# Patient Record
Sex: Male | Born: 1960
Health system: Southern US, Community
[De-identification: ages and names within clinical notes are randomized; demographics above are authoritative.]

## PROBLEM LIST (undated history)

## (undated) DIAGNOSIS — R011 Cardiac murmur, unspecified: Secondary | ICD-10-CM

## (undated) DIAGNOSIS — G4730 Sleep apnea, unspecified: Secondary | ICD-10-CM

## (undated) DIAGNOSIS — E785 Hyperlipidemia, unspecified: Secondary | ICD-10-CM

## (undated) DIAGNOSIS — T7840XA Allergy, unspecified, initial encounter: Secondary | ICD-10-CM

## (undated) DIAGNOSIS — F32A Depression, unspecified: Secondary | ICD-10-CM

## (undated) DIAGNOSIS — F329 Major depressive disorder, single episode, unspecified: Secondary | ICD-10-CM

## (undated) DIAGNOSIS — E8881 Metabolic syndrome: Secondary | ICD-10-CM

## (undated) DIAGNOSIS — I10 Essential (primary) hypertension: Secondary | ICD-10-CM

## (undated) HISTORY — DX: Essential (primary) hypertension: I10

## (undated) HISTORY — DX: Major depressive disorder, single episode, unspecified: F32.9

## (undated) HISTORY — DX: Depression, unspecified: F32.A

## (undated) HISTORY — DX: Allergy, unspecified, initial encounter: T78.40XA

## (undated) HISTORY — DX: Hyperlipidemia, unspecified: E78.5

## (undated) HISTORY — PX: VASECTOMY: SHX75

## (undated) HISTORY — DX: Metabolic syndrome: E88.81

## (undated) HISTORY — DX: Cardiac murmur, unspecified: R01.1

## (undated) HISTORY — DX: Sleep apnea, unspecified: G47.30

## (undated) HISTORY — DX: Metabolic syndrome: E88.810

---

## 2003-12-17 ENCOUNTER — Encounter: Admission: RE | Admit: 2003-12-17 | Discharge: 2004-01-31 | Payer: Self-pay | Admitting: Neurosurgery

## 2008-10-23 ENCOUNTER — Encounter: Payer: Self-pay | Admitting: Family Medicine

## 2009-10-21 ENCOUNTER — Ambulatory Visit: Payer: Self-pay | Admitting: Family Medicine

## 2009-10-21 DIAGNOSIS — R5383 Other fatigue: Secondary | ICD-10-CM

## 2009-10-21 DIAGNOSIS — F329 Major depressive disorder, single episode, unspecified: Secondary | ICD-10-CM | POA: Insufficient documentation

## 2009-10-21 DIAGNOSIS — F3289 Other specified depressive episodes: Secondary | ICD-10-CM | POA: Insufficient documentation

## 2009-10-21 DIAGNOSIS — R5381 Other malaise: Secondary | ICD-10-CM | POA: Insufficient documentation

## 2009-10-21 DIAGNOSIS — I10 Essential (primary) hypertension: Secondary | ICD-10-CM | POA: Insufficient documentation

## 2009-10-22 ENCOUNTER — Encounter (INDEPENDENT_AMBULATORY_CARE_PROVIDER_SITE_OTHER): Payer: Self-pay | Admitting: *Deleted

## 2009-10-22 ENCOUNTER — Ambulatory Visit: Payer: Self-pay | Admitting: Family Medicine

## 2009-10-22 LAB — CONVERTED CEMR LAB
Bilirubin Urine: NEGATIVE
Glucose, Urine, Semiquant: NEGATIVE

## 2009-10-24 LAB — CONVERTED CEMR LAB
BUN: 15 mg/dL (ref 6–23)
Basophils Relative: 0.2 % (ref 0.0–3.0)
Bilirubin, Direct: 0 mg/dL (ref 0.0–0.3)
CO2: 32 meq/L (ref 19–32)
Chloride: 99 meq/L (ref 96–112)
Cholesterol: 245 mg/dL — ABNORMAL HIGH (ref 0–200)
Creatinine, Ser: 1 mg/dL (ref 0.4–1.5)
Direct LDL: 154.7 mg/dL
Eosinophils Absolute: 0.1 10*3/uL (ref 0.0–0.7)
MCHC: 31.8 g/dL (ref 30.0–36.0)
MCV: 92 fL (ref 78.0–100.0)
Monocytes Absolute: 0.4 10*3/uL (ref 0.1–1.0)
Neutrophils Relative %: 61.3 % (ref 43.0–77.0)
PSA: 0.82 ng/mL (ref 0.10–4.00)
Platelets: 218 10*3/uL (ref 150.0–400.0)
TSH: 1.65 microintl units/mL (ref 0.35–5.50)
Total Bilirubin: 0.7 mg/dL (ref 0.3–1.2)
Total Protein: 7.7 g/dL (ref 6.0–8.3)

## 2009-11-07 ENCOUNTER — Encounter (INDEPENDENT_AMBULATORY_CARE_PROVIDER_SITE_OTHER): Payer: Self-pay | Admitting: *Deleted

## 2009-11-10 ENCOUNTER — Ambulatory Visit: Payer: Self-pay | Admitting: Gastroenterology

## 2009-11-17 ENCOUNTER — Ambulatory Visit: Payer: Self-pay | Admitting: Family Medicine

## 2009-11-17 DIAGNOSIS — J309 Allergic rhinitis, unspecified: Secondary | ICD-10-CM | POA: Insufficient documentation

## 2009-11-17 DIAGNOSIS — R197 Diarrhea, unspecified: Secondary | ICD-10-CM | POA: Insufficient documentation

## 2009-11-26 ENCOUNTER — Ambulatory Visit: Payer: Self-pay | Admitting: Gastroenterology

## 2009-11-28 ENCOUNTER — Encounter: Payer: Self-pay | Admitting: Gastroenterology

## 2009-12-10 ENCOUNTER — Ambulatory Visit: Payer: Self-pay | Admitting: Family Medicine

## 2009-12-12 ENCOUNTER — Encounter: Payer: Self-pay | Admitting: Family Medicine

## 2009-12-16 ENCOUNTER — Encounter (INDEPENDENT_AMBULATORY_CARE_PROVIDER_SITE_OTHER): Payer: Self-pay | Admitting: *Deleted

## 2010-01-15 ENCOUNTER — Ambulatory Visit: Payer: Self-pay | Admitting: Gastroenterology

## 2010-01-21 ENCOUNTER — Telehealth: Payer: Self-pay | Admitting: Family Medicine

## 2010-01-22 ENCOUNTER — Ambulatory Visit: Payer: Self-pay | Admitting: Family Medicine

## 2010-01-22 DIAGNOSIS — E538 Deficiency of other specified B group vitamins: Secondary | ICD-10-CM | POA: Insufficient documentation

## 2010-01-22 LAB — CONVERTED CEMR LAB
Folate: 10.1 ng/mL
IgA: 332 mg/dL (ref 68–378)

## 2010-01-28 ENCOUNTER — Ambulatory Visit: Payer: Self-pay | Admitting: Family Medicine

## 2010-02-04 ENCOUNTER — Ambulatory Visit: Payer: Self-pay | Admitting: Family Medicine

## 2010-02-05 ENCOUNTER — Encounter: Payer: Self-pay | Admitting: Family Medicine

## 2010-02-19 ENCOUNTER — Ambulatory Visit: Payer: Self-pay | Admitting: Gastroenterology

## 2010-02-19 LAB — CONVERTED CEMR LAB
Sed Rate: 10 mm/hr (ref 0–22)
TSH: 1.91 microintl units/mL (ref 0.35–5.50)

## 2010-03-04 ENCOUNTER — Ambulatory Visit: Payer: Self-pay | Admitting: Family Medicine

## 2010-03-05 ENCOUNTER — Encounter: Payer: Self-pay | Admitting: Gastroenterology

## 2010-04-14 ENCOUNTER — Telehealth: Payer: Self-pay | Admitting: Gastroenterology

## 2010-10-07 ENCOUNTER — Ambulatory Visit
Admission: RE | Admit: 2010-10-07 | Discharge: 2010-10-07 | Payer: Self-pay | Source: Home / Self Care | Attending: Family Medicine | Admitting: Family Medicine

## 2010-10-07 DIAGNOSIS — J029 Acute pharyngitis, unspecified: Secondary | ICD-10-CM | POA: Insufficient documentation

## 2010-10-07 LAB — CONVERTED CEMR LAB: Rapid Strep: POSITIVE

## 2010-10-29 NOTE — Letter (Signed)
Summary: [facility] Instructions  [facility]  [address] [place], [place] [address]   Phone: [phone]  Fax: [phone]       Gregg Hamilton    01-18-1961    MRN: [id number]        Procedure Day [name]:  Wednesday 11/26/09     Arrival Time:  12:30pm     Procedure Time:  1:30pm     Location of Procedure:                    _X _  [facility] (4th Floor)                        PREPARATION FOR COLONOSCOPY WITH MOVIPREP   Starting 5 days prior to your procedure 11/22/09 do not eat nuts, seeds, popcorn, corn, beans, peas,  salads, or any raw vegetables.  Do not take any fiber supplements (e.g. Metamucil, Citrucel, and Benefiber).  THE DAY BEFORE YOUR PROCEDURE         DATE: Tuesday    11/25/09  1.  Drink clear liquids the entire day-NO SOLID FOOD  2.  Do not drink anything colored red or purple.  Avoid juices with pulp.  No orange juice.  3.  Drink at least 64 oz. (8 glasses) of fluid/clear liquids during the day to prevent dehydration and help the prep work efficiently.  CLEAR LIQUIDS INCLUDE: Water Jello Ice Popsicles Tea (sugar ok, no milk/cream) Powdered fruit flavored drinks Coffee (sugar ok, no milk/cream) Gatorade Juice: apple, white grape, white cranberry  Lemonade Clear bullion, consomm, broth Carbonated beverages (any kind) Strained chicken noodle soup Hard Candy                             4.  In the morning, mix first dose of MoviPrep solution:    Empty 1 Pouch A and 1 Pouch B into the disposable container    Add lukewarm drinking water to the top line of the container. Mix to dissolve    Refrigerate (mixed solution should be used within 24 hrs)  5.  Begin drinking the prep at 5:00 p.m. The MoviPrep container is divided by 4 marks.   Every 15 minutes drink the solution down to the next mark (approximately 8 oz) until the full liter is complete.   6.  Follow completed prep with 16 oz of clear liquid of your choice (Nothing  red or purple).  Continue to drink clear liquids until bedtime.  7.  Before going to bed, mix second dose of MoviPrep solution:    Empty 1 Pouch A and 1 Pouch B into the disposable container    Add lukewarm drinking water to the top line of the container. Mix to dissolve    Refrigerate  THE DAY OF YOUR PROCEDURE      DATE: 11/26/09   Wednesday  Beginning at  8:30am (5 hours before procedure):         1. Every 15 minutes, drink the solution down to the next mark (approx 8 oz) until the full liter is complete.  2. Follow completed prep with 16 oz. of clear liquid of your choice.    3. You may drink clear liquids until  11:30am (2 HOURS BEFORE PROCEDURE).   MEDICATION INSTRUCTIONS  Unless otherwise instructed, you should take regular prescription medications with a small sip of water   as early as possible  the morning of your procedure.   Additional medication instructions: _Hold your HCTZ  AM of your procedure         OTHER INSTRUCTIONS  You will need a responsible adult at least 51 years of age to accompany you and drive you home.   This person must remain in the waiting room during your procedure.  Wear loose fitting clothing that is easily removed.  Leave jewelry and other valuables at home.  However, you may wish to bring a book to read or  an iPod/MP3 player to listen to music as you wait for your procedure to start.  Remove all body piercing jewelry and leave at home.  Total time from sign-in until discharge is approximately 2-3 hours.  You should go home directly after your procedure and rest.  You can resume normal activities the  day after your procedure.  The day of your procedure you should not:   Drive   Make legal decisions   Operate machinery   Drink alcohol   Return to work  You will receive specific instructions about eating, activities and medications before you leave.    The above instructions have been reviewed and explained to me by    Clide Cliff, RN______________________    I fully understand and can verbalize these instructions _____________________________ Date _________

## 2010-10-29 NOTE — Miscellaneous (Signed)
Summary: screening colon age--ch.  Clinical Lists Changes  Medications: Added new medication of MOVIPREP 100 GM  SOLR (PEG-KCL-NACL-NASULF-NA ASC-C) As directed - Signed Rx of MOVIPREP 100 GM  SOLR (PEG-KCL-NACL-NASULF-NA ASC-C) As directed;  #1 x 0;  Signed;  Entered by: Clide Cliff RN;  Authorized by: Mardella Layman MD [facility];  Method used: Electronically to [facility] [address] [place], [place], [place]  [address], Ph: [phone], Fax: [phone] Allergies: Changed allergy or adverse reaction from [facility] to [facility] Observations: Added new observation of ALLERGY REV: Done (11/10/2009 8:11)    Prescriptions: MOVIPREP 100 GM  SOLR (PEG-KCL-NACL-NASULF-NA ASC-C) As directed  #1 x 0   Entered by:   Clide Cliff RN   Authorized by:   Mardella Layman MD [facility]   Signed by:   Clide Cliff RN on 11/10/2009   Method used:   Electronically to        [facility] [address] (retail)       [address]       [place], [place]  [address]       Ph: [phone]       Fax: [phone]   RxID:   [id number]

## 2010-10-29 NOTE — Procedures (Signed)
Summary: Colonoscopy  Patient: Gregg Hamilton Note: All result statuses are Final unless otherwise noted.  Tests: (1) Colonoscopy (COL)   COL Colonoscopy           DONE     [facility]     [address]     [place], [place]  [address]           COLONOSCOPY PROCEDURE REPORT           PATIENT:  Gregg Hamilton, Gregg Hamilton  MR#:  [id number]     BIRTHDATE:  Nov 08, 1960, 49 yrs. old  GENDER:  male           ENDOSCOPIST:  Vania Rea. Jarold Motto, MD, [facility]     Referred by:  Evelena Peat, M.D.           PROCEDURE DATE:  11/26/2009     PROCEDURE:  Colonoscopy with biopsy     ASA CLASS:  Class II     INDICATIONS:  Routine Risk Screening           MEDICATIONS:   Fentanyl 75 mcg IV, Versed 6 mg IV           DESCRIPTION OF PROCEDURE:   After the risks benefits and     alternatives of the procedure were thoroughly explained, informed     consent was obtained.  Digital rectal exam was performed and     revealed no abnormalities.   The LB CF-[id number] endoscope     was introduced through the anus and advanced to the cecum, which     was identified by both the appendix and ileocecal valve, without     limitations.  The quality of the prep was excellent, using     MoviPrep.  The instrument was then slowly withdrawn as the colon     was fully examined.     <<PROCEDUREIMAGES>>           FINDINGS:  No polyps or cancers were seen.  This was otherwise a     normal examination of the colon. some nonspecific rectal irration     biopsied.   Retroflexed views in the rectum revealed no     abnormalities.    The scope was then withdrawn from the patient     and the procedure completed.           COMPLICATIONS:  None           ENDOSCOPIC IMPRESSION:     1) No polyps or cancers     2) Otherwise normal examination     RECOMMENDATIONS:     1) Continue current colorectal screening recommendations for     "routine risk" patients with a repeat colonoscopy in 10 years.     2) continue current medications    3) Await biopsy results           REPEAT EXAM:  No           ______________________________     Vania Rea. Jarold Motto, MD, [facility]           CC:           n.     eSIGNED:   Vania Rea. Jelicia Nantz at 11/26/2009 01:56 PM           Mariel Aloe, [id number]  Note: An exclamation mark (!) indicates a result that was not dispersed into the flowsheet. Document Creation Date: 11/26/2009 1:56 PM _______________________________________________________________________  (1) Order result status: Final Collection or observation date-time: 11/26/2009  13:46 Requested date-time:  Receipt date-time:  Reported date-time:  Referring Physician:   Ordering Physician: Sheryn Bison [phone]) Specimen Source:  Source: Launa Grill Order Number: [id number] Lab site:   Appended Document: Colonoscopy     Procedures Next Due Date:    Colonoscopy: 11/2019

## 2010-10-29 NOTE — Assessment & Plan Note (Signed)
Summary: F/U APPT...LSW.   History of Present Illness Visit Type: Follow-up Visit Primary GI MD: Sheryn Bison MD FACP FAGA Primary Provider: Evelena Peat, MD Requesting Provider: Evelena Peat, MD Chief Complaint: follow-up for chronic diarrhea  still c/o daily diarrhea but it has decreased to 3 times per day History of Present Illness:   Continued 3-4 loose stools a day without other GI symptoms despite treatment with Entocort 9 mg a day. The patient relates he has had diarrhea for 6 months after initial antibiotic therapy. He has not been on probiotics, but did have a negative stool C. difficile exam. Initially been referred for screening colonoscopy and biopsy suggested possible lymphocytic colitis. Evaluation for celiac serology was negative. He is on multiple medications listed and reviewed in his record.  He says that the Imodium does give him some relief but causes constipation. He has been found to be B12 deficient.   GI Review of Systems      Denies abdominal pain, acid reflux, belching, bloating, chest pain, dysphagia with liquids, dysphagia with solids, heartburn, loss of appetite, nausea, vomiting, vomiting blood, weight loss, and  weight gain.      Reports diarrhea.     Denies anal fissure, black tarry stools, change in bowel habit, constipation, diverticulosis, fecal incontinence, heme positive stool, hemorrhoids, irritable bowel syndrome, jaundice, light color stool, liver problems, rectal bleeding, and  rectal pain.    Current Medications (verified): 1)  Lisinopril-Hydrochlorothiazide 20-12.5 Mg Tabs (Lisinopril-Hydrochlorothiazide) .... Once Daily 2)  Epipen 2-Pak 0.3 Mg/0.46ml Devi (Epinephrine) .... As Needed Allergic To Shellfish 3)  Sertraline Hcl 50 Mg Tabs (Sertraline Hcl) .... One By Mouth Once Daily 4)  Clarinex 5 Mg Tabs (Desloratadine) .... Once Daily As Needed 5)  Astelin 137 Mcg/spray Soln (Azelastine Hcl) .... 2 Sprays Per Nostril Two Times A Day As  Needed Allergies 6)  Entocort Ec 3 Mg Xr24h-Cap (Budesonide) .... 3 Tabs Q Am 7)  Cyanocobalamin 1000 Mcg/ml Inj Soln (Cyanocobalamin) .Marland Kitchen.. 1 Cc Im Weekly X 3 Weeks Then Monthly 8)  Singulair 10 Mg Tabs (Montelukast Sodium) .... Once Daily  Allergies (verified): 1)  ! Bethanne Ginger  Past History:  Past medical, surgical, family and social histories (including risk factors) reviewed for relevance to current acute and chronic problems.  Past Medical History: Reviewed history from 10/21/2009 and no changes required. Depression Hypertension Hay fever, allergies Heart murmur  Past Surgical History: Reviewed history from 10/21/2009 and no changes required. Vasectomy 1987  Family History: Reviewed history from 10/21/2009 and no changes required. Family History Hypertension Diabetes, mother Heart disease, mother No FH of Colon Cancer:  Social History: Reviewed history from 01/15/2010 and no changes required. Occupation: Airline pilot Never Smoked Alcohol use-yes Regular exercise-yes Married 1 boy 1 girl Daily Caffeine Use  4-6 per day  Review of Systems       The patient complains of allergy/sinus.  The patient denies anemia, anxiety-new, arthritis/joint pain, back pain, blood in urine, breast changes/lumps, change in vision, confusion, cough, coughing up blood, depression-new, fainting, fatigue, fever, headaches-new, hearing problems, heart murmur, heart rhythm changes, itching, muscle pains/cramps, night sweats, nosebleeds, shortness of breath, skin rash, sleeping problems, sore throat, swelling of feet/legs, swollen lymph glands, thirst - excessive, urination - excessive, urination changes/pain, urine leakage, vision changes, and voice change.    Vital Signs:  Patient profile:   50 year old male Height:      68.50 inches Weight:      242 pounds BMI:     36.39  Pulse rate:   68 / minute Pulse rhythm:   regular BP sitting:   120 / 82  (left arm)  Vitals Entered By: Milford Cage  NCMA (Feb 19, 2010 8:57 AM)  Physical Exam  General:  Well developed, well nourished, no acute distress.healthy appearing.  healthy appearing.   Head:  Normocephalic and atraumatic. Eyes:  PERRLA, no icterus. Psych:  Alert and cooperative. Normal mood and affect.   Impression & Recommendations:  Problem # 1:  B12 DEFICIENCY (ICD-266.2) Assessment Improved Continue parenteral replacement therapy. His diarrhea and B12 deficiency suggest possible bacterial overgrowth syndrome and I decided to treat him with Xifaxan 550 mg twice a day for 2 weeks with probiotic therapy. Further stool analysis and screening lab tests have been ordered to exclude malabsorption. Fecal Elastase-1, repeat stool for parasite, stool culture, Giardia antigens, sedimentation rate, serum carotene, and serum trypsinogen  level ordered.We will stop adequate therapy since he has had no response.  Problem # 2:  DIARRHEA (ICD-787.91) Assessment: Unchanged Possible bacterial overgrowth syndrome versus postenteritis IBS. Workup per above.  Problem # 3:  VITAMIN B12 DEFICIENCY (ICD-266.2) Assessment: Improved  Other Orders: TLB-Sedimentation Rate (ESR) (85652-ESR) TLB-TSH (Thyroid Stimulating Hormone) (84443-TSH) T-Beta Carotene [phone]) T-Giardia Lamblia IFA [phone]) T-Trypsinogen Serum [phone]) T-Culture, Stool ([id number]) T-Stool Giardia / Crypto- EIA (42595) T- * Misc. Laboratory test [phone])  Patient Instructions: 1)  Please go to the basement for lab work.  2)  Stop Entocort. 3)  Begin Xifaxin two times a day for 2 weeks. 4)  Begin Align daily for 2 weeks. 5)  The medication list was reviewed and reconciled.  All changed / newly prescribed medications were explained.  A complete medication list was provided to the patient / caregiver. 6)  Copy sent to Dr. Evelena Peat.:   Appended Document: F/U APPT...LSW.    Clinical Lists Changes  Medications: Removed medication of ENTOCORT  EC 3 MG XR24H-CAP (BUDESONIDE) 3 tabs q am Added new medication of XIFAXAN 550 MG TABS (RIFAXIMIN) two times a day for 2 weeks - Signed Rx of XIFAXAN 550 MG TABS (RIFAXIMIN) two times a day for 2 weeks;  #28 x 0;  Signed;  Entered by: Ashok Cordia RN;  Authorized by: Mardella Layman MD [facility];  Method used: Electronically to [facility] [address], [place], [place], [place]  [address], Ph: [phone], Fax: [phone]    Prescriptions: XIFAXAN 550 MG TABS (RIFAXIMIN) two times a day for 2 weeks  #28 x 0   Entered by:   Ashok Cordia RN   Authorized by:   Mardella Layman MD [facility]   Signed by:   Ashok Cordia RN on 02/19/2010   Method used:   Electronically to        [facility] [address] (retail)       [address]       [place], [place]  [address]       Ph: [phone]       Fax: [phone]   RxID:   [id number]    Appended Document: F/U APPT...LSW.    Clinical Lists Changes  Medications: Added new medication of ALIGN   CAPS (MISC INTESTINAL FLORA REGULAT) Take one capsule by mouth daily

## 2010-10-29 NOTE — Letter (Signed)
Summary: New Patient letter  [facility]  [address] [place], [place] [address]   Phone: [phone]  Fax: [phone]       12/16/2009 MRN: [id number]  Gregg Hamilton [address] [place], [place]  [address]  Dear Mr. Gregg Hamilton,  Welcome to the Gastroenterology Division at [facility].    You are scheduled to see Dr.  Jarold Motto on 01-15-10 at [facility] on the 3rd floor at [facility], [address]  We ask that you try to arrive at our office 15 minutes prior to your appointment time to allow for check-in.  We would like you to complete the enclosed self-administered evaluation form prior to your visit and bring it with you on the day of your appointment.  We will review it with you.  Also, please bring a complete list of all your medications or, if you prefer, bring the medication bottles and we will list them.  Please bring your insurance card so that we may make a copy of it.  If your insurance requires a referral to see a specialist, please bring your referral form from your primary care physician.  Co-payments are due at the time of your visit and may be paid by cash, check or credit card.     Your office visit will consist of a consult with your physician (includes a physical exam), any laboratory testing he/she may order, scheduling of any necessary diagnostic testing (e.g. x-ray, ultrasound, CT-scan), and scheduling of a procedure (e.g. Endoscopy, Colonoscopy) if required.  Please allow enough time on your schedule to allow for any/all of these possibilities.    If you cannot keep your appointment, please call [phone] to cancel or reschedule prior to your appointment date.  This allows [place] the opportunity to schedule an appointment for another patient in need of care.  If you do not cancel or reschedule by 5 p.m. the business day prior to your appointment date, you will be charged a $50.00 late cancellation/no-show fee.    Thank you for choosing [facility] for your medical needs.  We appreciate the opportunity to care for you.  Please visit [place] at our website  to learn more about our practice.                     Sincerely,                                                             The Gastroenterology Division

## 2010-10-29 NOTE — Progress Notes (Signed)
Summary: results request  Phone Note Call from Patient Call back at Home Phone [phone]   Caller: Patient Call For: Dr. Jarold Motto Reason for Call: Talk to Nurse Summary of Call: would like labwork results ins co needs additional information as to why labwork was necessary Initial call taken by: Vallarie Mare,  April 14, 2010 4:54 PM  Follow-up for Phone Call        Pt informed of lab results.  Pt states some of the lab and stool studies were not covered by insurance and he was told that if he couold get an explaination of why labs were ordered then insurance may cover the test.  Pt was told that some of the test were "experimental".  Pt is not sure what test they are talking about.  Pt states he recieved a letter.  Pt will bring letter by and let us make a copy. Follow-up by: Ashok Cordia RN,  April 15, 2010 9:17 AM

## 2010-10-29 NOTE — Medication Information (Signed)
Summary: Coverage Approval for Singulair  Coverage Approval for Singulair   Imported By: Maryln Gottron 02/11/2010 09:41:38  _____________________________________________________________________  External Attachment:    Type:   Image     Comment:   External Document

## 2010-10-29 NOTE — Assessment & Plan Note (Signed)
Summary: b12 inj/njr, order received for B-12 weekly X 3 weeks, then mont  Nurse Visit   Allergies: 1)  ! * Shelfish  Medication Administration  Injection # 1:    Medication: Vit B12 1000 mcg    Diagnosis: B12 DEFICIENCY (ICD-266.2)    Route: IM    Site: L deltoid    Exp Date: 05/29/2011    Lot #: [id number]    Mfr: American Regent    Patient tolerated injection without complications    Given by: Sid Falcon LPN (January 22, 2010 4:23 PM)  Orders Added: 1)  Vit B12 1000 mcg [J3420] 2)  Admin of Therapeutic Inj  intramuscular or subcutaneous [96045]

## 2010-10-29 NOTE — Letter (Signed)
Summary: Previsit letter  [facility]  [address] [place], [place] [address]   Phone: [phone]  Fax: [phone]       10/22/2009 MRN: [id number]  Gregg Hamilton [address] [place], [place]  [address]  Dear Gregg Hamilton,  Welcome to the Gastroenterology Division at [facility].    You are scheduled to see a nurse for your pre-procedure visit on 11-10-09 at 8:00a.m. on the 3rd floor at [facility], [address]  We ask that you try to arrive at our office 15 minutes prior to your appointment time to allow for check-in.  Your nurse visit will consist of discussing your medical and surgical history, your immediate family medical history, and your medications.    Please bring a complete list of all your medications or, if you prefer, bring the medication bottles and we will list them.  We will need to be aware of both prescribed and over the counter drugs.  We will need to know exact dosage information as well.  If you are on blood thinners (Coumadin, Plavix, Aggrenox, Ticlid, etc.) please call our office today/prior to your appointment, as we need to consult with your physician about holding your medication.   Please be prepared to read and sign documents such as consent forms, a financial agreement, and acknowledgement forms.  If necessary, and with your consent, a friend or relative is welcome to sit-in on the nurse visit with you.  Please bring your insurance card so that we may make a copy of it.  If your insurance requires a referral to see a specialist, please bring your referral form from your primary care physician.  No co-pay is required for this nurse visit.     If you cannot keep your appointment, please call [phone] to cancel or reschedule prior to your appointment date.  This allows [place] the opportunity to schedule an appointment for another patient in need of care.    Thank you for choosing [facility] for your medical needs.   We appreciate the opportunity to care for you.  Please visit [place] at our website  to learn more about our practice.                     Sincerely.                                                                                                                   The Gastroenterology Division

## 2010-10-29 NOTE — Letter (Signed)
Summary: Needs B12 Shots/[facility]  Needs B12 Shots/ GI   Imported By: Maryln Gottron 01/27/2010 14:11:41  _____________________________________________________________________  External Attachment:    Type:   Image     Comment:   External Document

## 2010-10-29 NOTE — Medication Information (Signed)
Summary: Prior Review/Certification Faxback for Singulair  Prior Review/Certification Faxback for Singulair   Imported By: Maryln Gottron 03/02/2010 11:12:26  _____________________________________________________________________  External Attachment:    Type:   Image     Comment:   External Document

## 2010-10-29 NOTE — Progress Notes (Signed)
Summary: B12  Phone Note From Other Clinic   Caller: Dr. Norval Gable office Call For: Dr. Caryl Never Summary of Call: [facility] is having problems getting B12 injectable at their office.  Would like Dr. Caryl Never to get patient started on B12.  Will send order to [place],  and pt will schedule injection times. Initial call taken by: Lynann Beaver CMA,  January 21, 2010 4:09 PM  Follow-up for Phone Call        OK to schedule. Follow-up by: Evelena Peat MD,  January 21, 2010 4:11 PM

## 2010-10-29 NOTE — Assessment & Plan Note (Signed)
Summary: ?allergies/njr   Vital Signs:  Patient profile:   50 year old male Temp:     98 degrees F oral BP sitting:   130 / 84  (left arm) Cuff size:   large  Vitals Entered By: Sid Falcon LPN (December 10, 2009 4:10 PM) CC: Allergies, itichy eyes   History of Present Illness: Patient seen for the following items.  History of severe allergies mostly spring. Currently on Clarinex but still has significant symptoms of nasal congestion and watery itchy eyes. Has tried Zyrtec and Allegra in the past without much success. May have been on nasal steroid but is not sure. Does not recall using nasal antihistamines or Singulair.  Persistent diarrhea since around October or November. Generally has about 6-7 nonbloody watery stools per day. Imodium helps temporarily. Recent colonoscopy unremarkable. No appetite or weight changes. Does recall being on antibiotic prior to this starting. Never any bloody stools.  No abd pain or cramping.  Recent CPE and labs (TSH, hepatic, albumin, CBC all normal).  Allergies: 1)  ! Bethanne Ginger  Past History:  Past Medical History: Last updated: 10/21/2009 Depression Hypertension Hay fever, allergies Heart murmur  Social History: Last updated: 10/21/2009 Occupation: Never Smoked Alcohol use-yes Regular exercise-yes PMH reviewed for relevance, PSH reviewed for relevance  Review of Systems  The patient denies anorexia, fever, weight loss, hoarseness, dyspnea on exertion, abdominal pain, melena, hematochezia, severe indigestion/heartburn, and muscle weakness.    Physical Exam  General:  Well-developed,well-nourished,in no acute distress; alert,appropriate and cooperative throughout examination Eyes:  pupils equal, pupils round, and pupils reactive to light.   Ears:  External ear exam shows no significant lesions or deformities.  Otoscopic examination reveals clear canals, tympanic membranes are intact bilaterally without bulging, retraction,  inflammation or discharge. Hearing is grossly normal bilaterally. Nose:  External nasal examination shows no deformity or inflammation. Nasal mucosa are pink and moist without lesions or exudates. Mouth:  Oral mucosa and oropharynx without lesions or exudates.  Teeth in good repair. Neck:  No deformities, masses, or tenderness noted. Lungs:  Normal respiratory effort, chest expands symmetrically. Lungs are clear to auscultation, no crackles or wheezes. Heart:  Normal rate and regular rhythm. S1 and S2 normal without gallop, murmur, click, rub or other extra sounds. Psych:  normally interactive, good eye contact, not anxious appearing, and not depressed appearing.     Impression & Recommendations:  Problem # 1:  ALLERGIC RHINITIS (ICD-477.9) Assessment Deteriorated trial of Astelin nasal and Singulair 10 mg daily and he'll continue Clarinex. His updated medication list for this problem includes:    Clarinex 5 Mg Tabs (Desloratadine) ..... Once daily as needed    Astelin 137 Mcg/spray Soln (Azelastine hcl) .Marland Kitchen... 2 sprays per nostril two times a day as needed allergies  Problem # 2:  DIARRHEA (ICD-787.91)  obtain some stool studies. GI referral if these are unremarkable.  He does not have any weight loss, low albumin, or other suggestion of malabsorption.  Orders: T-Culture, C-Diff Toxin A/B ([address]) T-Stool for O&P ([address]-91478)  Complete Medication List: 1)  Lisinopril-hydrochlorothiazide 20-12.5 Mg Tabs (Lisinopril-hydrochlorothiazide) .... Once daily 2)  Epipen 2-pak 0.3 Mg/0.35ml Devi (Epinephrine) .... As needed allergic to shellfish 3)  Sertraline Hcl 50 Mg Tabs (Sertraline hcl) .... One by mouth once daily 4)  Clarinex 5 Mg Tabs (Desloratadine) .... Once daily as needed 5)  Astelin 137 Mcg/spray Soln (Azelastine hcl) .... 2 sprays per nostril two times a day as needed allergies  Patient Instructions: 1)  try addition of Singulair 10 mg daily 2)  Start Astelin nasal spray  1-2 sprays per nostril twice daily 3)  Continue Clarinex Prescriptions: ASTELIN 137 MCG/SPRAY SOLN (AZELASTINE HCL) 2 sprays per nostril two times a day as needed allergies  #1 x 11   Entered and Authorized by:   Evelena Peat MD   Signed by:   Evelena Peat MD on 12/10/2009   Method used:   Electronically to        [facility] [address] (retail)       [address]       [place], [place]  [address]       Ph: [phone]       Fax: [phone]   RxID:   [id number]

## 2010-10-29 NOTE — Assessment & Plan Note (Signed)
Summary: 1 MONTH FUP//CCM   Vital Signs:  Patient profile:   50 year old male Weight:      238 pounds BP sitting:   140 / 90  (left arm) Cuff size:   large  Vitals Entered By: Sid Falcon LPN (November 17, 2009 4:16 PM) CC: one month follow-up, Hypertension Management   History of Present Illness: Followup recent depression and anxiety. Started sertraline 50 mg daily and he seen some improvements especially in terms of improved sleep and feeling less anxious. Mood is improved somewhat as well. Initially had somewhat increased heart rate first few days but none since then.  Hypertension and we had received reported dosage of 10 mg lisinopril. He has actually been taking lisinopril HCTZ 20/12.5 one daily.  Patient scheduled for screening colonoscopy next week. He relates today several weeks of some loose to occasional watery, nonbloody stools. No recent travels. Was on one previous course of antibiotic about 6 weeks ago but his diarrhea stools preceded that.  No reported appetite or weight changes.  Hx perennial allergies and requests refill Clarinex which controls allergies.  Hypertension History:      He denies headache, chest pain, palpitations, dyspnea with exertion, orthopnea, peripheral edema, visual symptoms, syncope, and side effects from treatment.        Positive major cardiovascular risk factors include male age 72 years old or older and hypertension.  Negative major cardiovascular risk factors include non-tobacco-user status.     Allergies: 1)  ! Bethanne Ginger  Past History:  Past Medical History: Last updated: 10/21/2009 Depression Hypertension Hay fever, allergies Heart murmur  Past Surgical History: Last updated: 10/21/2009 Vasectomy 1987  Social History: Last updated: 10/21/2009 Occupation: Never Smoked Alcohol use-yes Regular exercise-yes PMH-FH-SH reviewed for relevance, PMH reviewed for relevance, PSH reviewed for relevance  Review of Systems  See HPI  Physical Exam  General:  Well-developed,well-nourished,in no acute distress; alert,appropriate and cooperative throughout examination Mouth:  Oral mucosa and oropharynx without lesions or exudates.  Teeth in good repair. Neck:  No deformities, masses, or tenderness noted. Lungs:  Normal respiratory effort, chest expands symmetrically. Lungs are clear to auscultation, no crackles or wheezes. Heart:  Normal rate and regular rhythm. S1 and S2 normal without gallop, murmur, click, rub or other extra sounds. Extremities:  no edema. Neurologic:  alert & oriented X3 and cranial nerves II-XII intact.   Psych:  normally interactive, good eye contact, not anxious appearing, and not depressed appearing.     Impression & Recommendations:  Problem # 1:  DEPRESSION (ICD-311) Assessment Improved  His updated medication list for this problem includes:    Sertraline Hcl 50 Mg Tabs (Sertraline hcl) ..... One by mouth once daily  Problem # 2:  HYPERTENSION (ICD-401.9) refilled for 1 year. His updated medication list for this problem includes:    Lisinopril-hydrochlorothiazide 20-12.5 Mg Tabs (Lisinopril-hydrochlorothiazide) ..... Once daily  Problem # 3:  ALLERGIC RHINITIS (ICD-477.9)  His updated medication list for this problem includes:    Clarinex 5 Mg Tabs (Desloratadine) ..... Once daily as needed  Problem # 4:  DIARRHEA (ICD-787.91) recent TSH normal.  Colonoscopy scheduled. Discussed other possible test including stool studies and at this point he will discuss with GI.  Complete Medication List: 1)  Lisinopril-hydrochlorothiazide 20-12.5 Mg Tabs (Lisinopril-hydrochlorothiazide) .... Once daily 2)  Epipen 2-pak 0.3 Mg/0.77ml Devi (Epinephrine) .... As needed allergic to shellfish 3)  Sertraline Hcl 50 Mg Tabs (Sertraline hcl) .... One by mouth once daily 4)  Clarinex 5 Mg  Tabs (Desloratadine) .... Once daily as needed  Hypertension Assessment/Plan:      The patient's  hypertensive risk group is category B: At least one risk factor (excluding diabetes) with no target organ damage.  Today's blood pressure is 140/90.    Patient Instructions: 1)  Please schedule a follow-up appointment in 6 months .  Prescriptions: CLARINEX 5 MG TABS (DESLORATADINE) once daily as needed  #90 x 3   Entered and Authorized by:   Evelena Peat MD   Signed by:   Evelena Peat MD on 11/17/2009   Method used:   Electronically to        [facility] [address] (retail)       [address]       [place], [place]  [address]       Ph: [phone]       Fax: [phone]   RxID:   [id number] LISINOPRIL-HYDROCHLOROTHIAZIDE 20-12.5 MG TABS (LISINOPRIL-HYDROCHLOROTHIAZIDE) once daily  #90 x 3   Entered and Authorized by:   Evelena Peat MD   Signed by:   Evelena Peat MD on 11/17/2009   Method used:   Electronically to        [facility] [address] (retail)       [address]       [place], [place]  [address]       Ph: [phone]       Fax: [phone]   RxID:   [id number]

## 2010-10-29 NOTE — Assessment & Plan Note (Signed)
Summary: b12 inj/njr  Nurse Visit   Allergies: 1)  ! * Shelfish  Medication Administration  Injection # 1:    Medication: Vit B12 1000 mcg    Diagnosis: VITAMIN B12 DEFICIENCY (ICD-266.2)    Route: IM    Site: R deltoid    Exp Date: 06/2602011    Lot #: 05/29/2011    Mfr: [employer]  Orders Added: 1)  Vit B12 1000 mcg [J3420]

## 2010-10-29 NOTE — Assessment & Plan Note (Signed)
Summary: Gregg Hamilton, Gregg Hamilton   Vital Signs:  Patient profile:   50 year old male Temp:     102.1 degrees F oral BP sitting:   140 / 88  (left arm) Cuff size:   large  Vitals Entered By: Sid Falcon LPN (October 07, 2010 12:04 PM)  History of Present Illness: Patient seen for the following:  Gregg throat onset 2 days ago. Associated headaches and body aches. Gregg Hamilton started yesterday. Denies nausea, vomiting, or body rash. Advil with minimal relief.   no sick exposures  Patient needs refills lisinopril HCTZ. Compliant with therapy.   No side effects.  Past history depression stable on sertraline 50 mg daily. Needs refills. No suicidal ideation. Reluctant to stop medication this time  Hypertension History:      He denies headache, chest pain, palpitations, dyspnea with exertion, orthopnea, PND, peripheral edema, visual symptoms, neurologic problems, syncope, and side effects from treatment.        Positive major cardiovascular risk factors include male age 64 years old or older and hypertension.  Negative major cardiovascular risk factors include non-tobacco-user status.     Allergies: 1)  ! 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  The patient denies anorexia, weight loss, hoarseness, chest pain, dyspnea  on exertion, prolonged cough, hemoptysis, and abdominal pain.    Physical Exam  General:  Well-developed,well-nourished,in no acute distress; alert,appropriate and cooperative throughout examination Ears:  External ear exam shows no significant lesions or deformities.  Otoscopic examination reveals clear canals, tympanic membranes are intact bilaterally without bulging, retraction, inflammation or discharge. Hearing is grossly normal bilaterally. Mouth:  post pharynx erythema with no exudate. Neck:  No deformities, masses, or tenderness noted. Lungs:  Normal respiratory effort, chest expands symmetrically. Lungs are clear to auscultation, no crackles or wheezes. Heart:  Normal rate and regular rhythm. S1 and S2 normal without gallop, murmur, click, rub or other extra sounds. Extremities:  No clubbing, cyanosis, edema, or deformity noted with normal full range of motion of all joints.   Skin:  no rashes.   Cervical Nodes:  tender AC nodes.   Impression & Recommendations:  Problem # 1:  Gregg THROAT (ICD-462) pos strep.  The following medications were removed from the medication list:    Xifaxan 550 Mg Tabs (Rifaximin) .Marland Kitchen..Marland Kitchen Two times a day for 2 weeks His updated medication list for this problem includes:    Amoxicillin 875 Mg Tabs (Amoxicillin) ..... One by mouth two times a day for 10 days  Orders: Rapid Strep ([address])  Problem # 2:  HYPERTENSION (ICD-401.9) stable..   Work on weight loss.  refilled med for one year. His updated medication list for this problem includes:    Lisinopril-hydrochlorothiazide 20-12.5 Mg Tabs (Lisinopril-hydrochlorothiazide) ..... Once daily  Problem # 3:  DEPRESSION (ICD-311)  His updated medication list for this problem includes:    Sertraline Hcl 50 Mg Tabs (Sertraline hcl) .Marland KitchenMarland KitchenMarland KitchenMarland Kitchen  One by mouth once daily  Complete Medication List: 1)  Lisinopril-hydrochlorothiazide 20-12.5 Mg Tabs (Lisinopril-hydrochlorothiazide) .... Once daily 2)  Epipen 2-pak 0.3  Mg/0.87ml Devi (Epinephrine) .... As needed allergic to shellfish 3)  Sertraline Hcl 50 Mg Tabs (Sertraline hcl) .... One by mouth once daily 4)  Clarinex 5 Mg Tabs (Desloratadine) .... Once daily as needed 5)  Astelin 137 Mcg/spray Soln (Azelastine hcl) .... 2 sprays per nostril two times a day as needed allergies 6)  Cyanocobalamin 1000 Mcg/ml Inj Soln (Cyanocobalamin) .Marland Kitchen.. 1 cc im weekly x 3 weeks then monthly 7)  Amoxicillin 875 Mg Tabs (Amoxicillin) .... One by mouth two times a day for 10 days  Hypertension Assessment/Plan:      The patient's hypertensive risk group is category B: At least one risk factor (excluding diabetes) with no target organ damage.  Today's blood pressure is 140/88.    Patient Instructions: 1)  Take your antibiotic as prescribed until ALL of it is gone, but stop if you develop a rash or swelling and contact our office as soon as possible.  2)  Continue advil or aleve for Gregg Hamilton, headache, and body aches. Prescriptions: SERTRALINE HCL 50 MG TABS (SERTRALINE HCL) one by mouth once daily  #90 x 3   Entered and Authorized by:   Evelena Peat MD   Signed by:   Evelena Peat MD on 10/07/2010   Method used:   Electronically to        [facility] [address] (retail)       [address]       [place], [place]  [address]       Ph: [phone]       Fax: [phone]   RxID:   [id number] LISINOPRIL-HYDROCHLOROTHIAZIDE 20-12.5 MG TABS (LISINOPRIL-HYDROCHLOROTHIAZIDE) once daily  #90 x 3   Entered and Authorized by:   Evelena Peat MD   Signed by:   Evelena Peat MD on 10/07/2010   Method used:   Electronically to        [facility] (retail)       [address]       [place], [place]  [address]       Ph: [phone]       Fax: [phone]   RxID:   [id number] AMOXICILLIN 875 MG TABS (AMOXICILLIN) one by mouth two times a day for 10 days  #20 x 0   Entered and  Authorized by:   Evelena Peat MD   Signed by:   Evelena Peat MD on 10/07/2010   Method used:   Electronically to        [facility] (retail)       [address]       [place], [place]  [address]       Ph: [phone]       Fax: [phone]   RxID:   [id number]    Orders Added: 1)  Rapid Strep [51761] 2)  Est. Patient Level IV [607[address]]    Laboratory Results  Date/Time Received: October 07, 2010 12:09 PM  Date/Time Reported: October 07, 2010 12:09 PM   Other Tests  Rapid Strep: positive Comments Wynona Canes, CMA  October 07, 2010 12:09 PM

## 2010-10-29 NOTE — Assessment & Plan Note (Signed)
Summary: NEW PT EST (TXFR FROM [facility]) // RS   Vital Signs:  Patient profile:   50 year old male Height:      68.50 inches Weight:      226 pounds BMI:     33.99 Temp:     98.9 degrees F oral Pulse rate:   80 / minute Pulse rhythm:   regular Resp:     12 per minute BP sitting:   130 / 88  (left arm) Cuff size:   large  Vitals Entered By: Sid Falcon LPN (October 21, 2009 10:38 AM)  Nutrition Counseling: Patient's BMI is greater than 25 and therefore counseled on weight management options. CC: New to establish from [place], pt fasting   History of Present Illness: New patient to establish and for complete physical examination.  Prior vasectomy in 1986. Past history depression but not treated. Hypertension treated with lisinopril 10 mg daily. No other chronic medical problems.  Family history significant for mother having diabetes and hypertension and coronary disease in her 67s. Father had diabetes and hypertension. No family history of cancer.  Patient is married. Works in alcohol sales. Nonsmoker. Exercises 3-4 days per week. Occasional alcohol use.  Tetanus 2008.  Patient has some recent depressive symptoms including early morning awakening, depressed mood, loss of interest in activities and low motivation over the past several weeks. No suicidal ideation. He would like to consider antidepressant medication. Denies any specific stressors. Also having some hot flash type symptoms and is inquiring whether low testosterone may be a cause. Low libido and low energy. Rare problems erectile dysfunction. Occasional chronic diarrhea. No history of prior screening colonoscopy  Preventive Screening-Counseling & Management  Alcohol-Tobacco     Smoking Status: never  Caffeine-Diet-Exercise     Does Patient Exercise: yes  Allergies (verified): 1)  ! Bethanne Ginger  Past History:  Family History: Last updated: 10/21/2009 Family History Hypertension Diabetes, mother Heart  disease, mother  Social History: Last updated: 10/21/2009 Occupation: Never Smoked Alcohol use-yes Regular exercise-yes  Risk Factors: Exercise: yes (10/21/2009)  Risk Factors: Smoking Status: never (10/21/2009)  Past Medical History: Depression Hypertension Hay fever, allergies Heart murmur  Past Surgical History: Vasectomy 1987  Family History: Family History Hypertension Diabetes, mother Heart disease, mother  Social History: Occupation: Never Smoked Alcohol use-yes Regular exercise-yes Smoking Status:  never Occupation:  employed Does Patient Exercise:  yes  Review of Systems  The patient denies anorexia, fever, weight loss, weight gain, vision loss, decreased hearing, chest pain, syncope, dyspnea on exertion, peripheral edema, prolonged cough, headaches, hemoptysis, abdominal pain, melena, hematochezia, severe indigestion/heartburn, hematuria, incontinence, muscle weakness, suspicious skin lesions, enlarged lymph nodes, and testicular masses.    Physical Exam  General:  Well-developed,well-nourished,in no acute distress; alert,appropriate and cooperative throughout examination Head:  Normocephalic and atraumatic without obvious abnormalities. No apparent alopecia or balding. Eyes:  No corneal or conjunctival inflammation noted. EOMI. Perrla. Funduscopic exam benign, without hemorrhages, exudates or papilledema. Vision grossly normal. Ears:  External ear exam shows no significant lesions or deformities.  Otoscopic examination reveals clear canals, tympanic membranes are intact bilaterally without bulging, retraction, inflammation or discharge. Hearing is grossly normal bilaterally. Mouth:  Oral mucosa and oropharynx without lesions or exudates.  Teeth in good repair. Neck:  No deformities, masses, or tenderness noted. Lungs:  Normal respiratory effort, chest expands symmetrically. Lungs are clear to auscultation, no crackles or wheezes. Heart:  Normal rate and  regular rhythm. S1 and S2 normal without gallop, murmur, click, rub or  other extra sounds. Abdomen:  Bowel sounds positive,abdomen soft and non-tender without masses, organomegaly or hernias noted. Rectal:  No external abnormalities noted. Normal sphincter tone. No rectal masses or tenderness. Genitalia:  Testes bilaterally descended without nodularity, tenderness or masses. No scrotal masses or lesions. No penis lesions or urethral discharge. Prostate:  Prostate gland firm and smooth, no enlargement, nodularity, tenderness, mass, asymmetry or induration. Msk:  No deformity or scoliosis noted of thoracic or lumbar spine.   Extremities:  No clubbing, cyanosis, edema, or deformity noted with normal full range of motion of all joints.   Neurologic:  No cranial nerve deficits noted. Station and gait are normal. Plantar reflexes are down-going bilaterally. DTRs are symmetrical throughout. Sensory, motor and coordinative functions appear intact. Skin:  Intact without suspicious lesions or rashes Cervical Nodes:  No lymphadenopathy noted Psych:  Oriented X3, good eye contact, and not anxious appearing.     Impression & Recommendations:  Problem # 1:  Preventive Health Care (ICD-V70.0) Cont regular exercise.  Obtain screening labs.  Schedule screening colonoscopy.  Problem # 2:  HYPERTENSION (ICD-401.9)  His updated medication list for this problem includes:    Lisinopril 10 Mg Tabs (Lisinopril) ..... Once daily  Problem # 3:  DEPRESSION (ICD-311) Start medication and f/u one month. His updated medication list for this problem includes:    Sertraline Hcl 50 Mg Tabs (Sertraline hcl) ..... One by mouth once daily  Problem # 4:  FATIGUE (ICD-780.79) check labs incl testosterone.  Complete Medication List: 1)  Lisinopril 10 Mg Tabs (Lisinopril) .... Once daily 2)  Epipen 2-pak 0.3 Mg/0.54ml Devi (Epinephrine) .... As needed allergic to shellfish 3)  Sertraline Hcl 50 Mg Tabs (Sertraline hcl)  .... One by mouth once daily  Other Orders: Gastroenterology Referral (GI)  Patient Instructions: 1)  Please schedule a follow-up appointment in 1 month.  2)  It is important that you exercise reguarly at least 20 minutes 5 times a week. If you develop chest pain, have severe difficulty breathing, or feel very tired, stop exercising immediately and seek medical attention.  3)  Schedule the following labs: 4)  lipid  V70.0 5)  bmp  V70.0 6)  cbc  V70.0 7)  hepatic  V70.0 8)  PSA  V70.0 9)  total testosterone 780.79 10)  UA   V70.0 Prescriptions: LISINOPRIL 10 MG TABS (LISINOPRIL) once daily  #90 x 3   Entered and Authorized by:   Evelena Peat MD   Signed by:   Evelena Peat MD on 10/21/2009   Method used:   Electronically to        [facility] (retail)       [address]       [place], [place]  [address]       Ph: [phone]       Fax: [phone]   RxID:   [id number] SERTRALINE HCL 50 MG TABS (SERTRALINE HCL) one by mouth once daily  #30 x 5   Entered and Authorized by:   Evelena Peat MD   Signed by:   Evelena Peat MD on 10/21/2009   Method used:   Electronically to        [facility] (retail)       [address]       [place], [place]  [address]  Ph: [phone]       Fax: [phone]   RxID:   [id number]   Preventive Care Screening  Last Tetanus Booster:    Date:  09/27/2006    Results:  Historical   Colonoscopy:    Date:  09/27/1993    Results:  normal

## 2010-10-29 NOTE — Assessment & Plan Note (Signed)
Summary: CHRONIC DIARRHEA/YF   History of Present Illness Visit Type: Initial Consult Primary GI MD: Sheryn Bison MD FACP FAGA Primary Provider: Evelena Peat, MD Requesting Provider: Evelena Peat, MD Chief Complaint: chronic diarrhea since October 6-7 stools per day History of Present Illness:   50 year old Caucasian male who had recently had a negative screening colonoscopy. He relates chronic diarrhea for the last 6 months which is watery in nature without systemic or other gastrointestinal symptomatology. He has use and says on occasions because of a "pinched nerve in the neck." He denies infectious disease exposure, foreign travel, or sick family members at home. Stool exams by primary care been negative.  His been no anorexia, weight loss, or systemic complaints. Colon biopsies are consistent with lymphocytic colitis. He does have essential hypertension and chronic depression. Family history is noncontributory.   GI Review of Systems      Denies abdominal pain, acid reflux, belching, bloating, chest pain, dysphagia with liquids, dysphagia with solids, heartburn, loss of appetite, nausea, vomiting, vomiting blood, weight loss, and  weight gain.      Reports change in bowel habits and  diarrhea.     Denies anal fissure, black tarry stools, constipation, diverticulosis, fecal incontinence, heme positive stool, hemorrhoids, irritable bowel syndrome, jaundice, light color stool, liver problems, rectal bleeding, and  rectal pain.    Current Medications (verified): 1)  Lisinopril-Hydrochlorothiazide 20-12.5 Mg Tabs (Lisinopril-Hydrochlorothiazide) .... Once Daily 2)  Epipen 2-Pak 0.3 Mg/0.55ml Devi (Epinephrine) .... As Needed Allergic To Shellfish 3)  Sertraline Hcl 50 Mg Tabs (Sertraline Hcl) .... One By Mouth Once Daily 4)  Clarinex 5 Mg Tabs (Desloratadine) .... Once Daily As Needed 5)  Astelin 137 Mcg/spray Soln (Azelastine Hcl) .... 2 Sprays Per Nostril Two Times A Day As  Needed Allergies  Allergies (verified): 1)  ! Bethanne Ginger  Past History:  Past medical, surgical, family and social histories (including risk factors) reviewed for relevance to current acute and chronic problems.  Past Medical History: Reviewed history from 10/21/2009 and no changes required. Depression Hypertension Hay fever, allergies Heart murmur  Past Surgical History: Reviewed history from 10/21/2009 and no changes required. Vasectomy 1987  Family History: Reviewed history from 10/21/2009 and no changes required. Family History Hypertension Diabetes, mother Heart disease, mother  Social History: Reviewed history from 10/21/2009 and no changes required. 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  The patient denies anemia, anxiety-new, arthritis/joint pain, blood in urine, breast changes/lumps, change in vision, confusion, cough, coughing up blood, depression-new, fainting, fatigue, fever, headaches-new, hearing problems, heart murmur, heart rhythm changes, itching, muscle pains/cramps, night sweats, nosebleeds, shortness of breath, skin rash, sleeping problems, sore throat, swelling of feet/legs, swollen lymph glands, thirst - excessive, urination - excessive, urination changes/pain, urine leakage, vision changes, and voice change.    Vital Signs:  Patient profile:   50 year old male Height:      68.50 inches Weight:      243.25 pounds BMI:     36.58 Pulse rate:   76 / minute Pulse rhythm:   regular BP sitting:   128 / 90  (left arm)  Physical Exam  General:  Well developed, well nourished, no acute distress. Head:  Normocephalic and atraumatic. Eyes:  PERRLA, no icterus.exam deferred to patient's ophthalmologist.   Abdomen:  Soft, nontender and nondistended.  No masses, hepatosplenomegaly or hernias  noted. Normal bowel sounds. Psych:  Alert and cooperative. Normal mood and affect.   Impression & Recommendations:  Problem # 1:  DIARRHEA (ICD-787.91) Assessment Unchanged Lymphocytic colitis possibly related to NSAID use. I have asked him to avoid NSAIDs and will start Entocort 9 mg a day with office followup in one month's time. Anemia profile and celiac antibodies have been ordered. Orders: TLB-B12, Serum-Total ONLY (36644-I34) TLB-Ferritin (82728-FER) TLB-Folic Acid (Folate) (82746-FOL) TLB-IBC Pnl (Iron/FE;Transferrin) (83550-IBC) TLB-IgA (Immunoglobulin A) (82784-IGA) T-Sprue Panel (Celiac Disease Aby Eval) (83516x3/[address])  Problem # 2:  HYPERTENSION (ICD-401.9) Assessment: Improved blood pressure today is normal at 128/90 and he is to continue his antihypertensive medications per Dr. Caryl Never  Patient Instructions: 1)  Please go to the basement for lab work. 2)  Begin Entocort daily. 3)  Please schedule a follow-up appointment in 1 month.  4)  The medication list was reviewed and reconciled.  All changed / newly prescribed medications were explained.  A complete medication list was provided to the patient / caregiver. 5)  Copy sent to : Dr. Evelena Peat 6)  Please continue current medications.  Prescriptions: ENTOCORT EC 3 MG XR24H-CAP (BUDESONIDE) 3 tabs q am  #90 x 3   Entered by:   Ashok Cordia RN   Authorized by:   Mardella Layman MD [facility]   Signed by:   Ashok Cordia RN on 01/15/2010   Method used:   Electronically to        [facility] [address] (retail)       [address]       [place], [place]  [address]       Ph: [phone]       Fax: [phone]   RxID:   [id number]

## 2010-10-29 NOTE — Letter (Signed)
Summary: Patient Notice- Polyp Results  [facility]  [address] [place], [place] [address]   Phone: [phone]  Fax: [phone]        November 28, 2009 MRN: [id number]    Gregg Hamilton [address] [place], [place]  [address]    Dear [name],  I am pleased to inform you that the colon polyp(s) removed during your recent colonoscopy was (were) found to be benign (no cancer detected) upon pathologic examination.  I recommend you have a repeat colonoscopy examination in 10_ years to look for recurrent polyps, as having colon polyps increases your risk for having recurrent polyps or even colon cancer in the future.  Should you develop new or worsening symptoms of abdominal pain, bowel habit changes or bleeding from the rectum or bowels, please schedule an evaluation with either your primary care physician or with me.  Additional information/recommendations:  _X_ No further action with gastroenterology is needed at this time. Please      follow-up with your primary care physician for your other healthcare      needs.  __ Please call [phone] to schedule a return visit to review your      situation.  __ Please keep your follow-up visit as already scheduled.  __ Continue treatment plan as outlined the day of your exam.  Please call us if you are having persistent problems or have questions about your condition that have not been fully answered at this time.  Sincerely,  Mardella Layman MD [facility]  This letter has been electronically signed by your physician.  Appended Document: Patient Notice- Polyp Results Letter mailed 3.8.11

## 2010-10-29 NOTE — Assessment & Plan Note (Signed)
Summary: b12 inj/njr  Nurse Visit   Allergies: 1)  ! * Shelfish  Medication Administration  Injection # 1:    Medication: Vit B12 1000 mcg    Diagnosis: B12 DEFICIENCY (ICD-266.2)    Route: IM    Site: L deltoid    Exp Date: 05/29/2011    Lot #: [id number]    Patient tolerated injection without complications    Given by: Sid Falcon LPN (Feb 04, 2010 10:53 AM)  Orders Added: 1)  Vit B12 1000 mcg [J3420] 2)  Admin of Therapeutic Inj  intramuscular or subcutaneous [91478]

## 2010-10-29 NOTE — Assessment & Plan Note (Signed)
Summary: b12 inj/pt will be here around 815am ok per nancy/njr  Nurse Visit   Allergies: 1)  ! * Shelfish  Medication Administration  Injection # 1:    Medication: Vit B12 1000 mcg    Diagnosis: B12 DEFICIENCY (ICD-266.2)    Route: IM    Site: R deltoid    Exp Date: 05/29/2011    Lot #: [id number]    Mfr: American Regent    Patient tolerated injection without complications    Given by: Sid Falcon LPN (Jan 29, 9603 8:41 AM)  Orders Added: 1)  Vit B12 1000 mcg [J3420] 2)  Admin of Therapeutic Inj  intramuscular or subcutaneous [96372] Prescriptions: SINGULAIR 10 MG TABS (MONTELUKAST SODIUM) once daily  #90 x 3   Entered by:   Sid Falcon LPN   Authorized by:   Evelena Peat MD   Signed by:   Sid Falcon LPN on 54/05/8118   Method used:   Electronically to        [facility] [address] (retail)       [address]       [place], [place]  [address]       Ph: [phone]       Fax: [phone]   RxID:   [id number]

## 2010-11-23 ENCOUNTER — Other Ambulatory Visit: Payer: Self-pay | Admitting: Family Medicine

## 2010-11-23 DIAGNOSIS — J309 Allergic rhinitis, unspecified: Secondary | ICD-10-CM

## 2010-11-23 MED ORDER — DESLORATADINE 5 MG PO TABS: 5.0000 mg | ORAL_TABLET | Freq: Every day | ORAL | Status: DC

## 2010-11-23 NOTE — Telephone Encounter (Signed)
OK to refill for one year.  

## 2010-11-23 NOTE — Telephone Encounter (Signed)
Refill clarinex to [facility]

## 2011-01-14 ENCOUNTER — Other Ambulatory Visit: Payer: Self-pay | Admitting: *Deleted

## 2011-01-14 MED ORDER — EPINEPHRINE 0.3 MG/0.3ML IJ DEVI: 0.3000 mg | Freq: Once | INTRAMUSCULAR | Status: AC

## 2011-08-12 ENCOUNTER — Ambulatory Visit: Payer: Self-pay | Admitting: Internal Medicine

## 2011-08-12 ENCOUNTER — Encounter: Payer: Self-pay | Admitting: Internal Medicine

## 2011-08-12 ENCOUNTER — Telehealth: Payer: Self-pay | Admitting: *Deleted

## 2011-08-12 ENCOUNTER — Ambulatory Visit (INDEPENDENT_AMBULATORY_CARE_PROVIDER_SITE_OTHER): Payer: BC Managed Care – PPO | Admitting: Internal Medicine

## 2011-08-12 VITALS — BP 110/80 | HR 93 | Temp 98.7°F | Wt 250.0 lb

## 2011-08-12 DIAGNOSIS — I10 Essential (primary) hypertension: Secondary | ICD-10-CM

## 2011-08-12 DIAGNOSIS — R059 Cough, unspecified: Secondary | ICD-10-CM

## 2011-08-12 DIAGNOSIS — J40 Bronchitis, not specified as acute or chronic: Secondary | ICD-10-CM | POA: Insufficient documentation

## 2011-08-12 DIAGNOSIS — R05 Cough: Secondary | ICD-10-CM

## 2011-08-12 MED ORDER — HYDROCODONE-HOMATROPINE 5-1.5 MG/5ML PO SYRP: 5.0000 mL | ORAL_SOLUTION | ORAL | Status: AC | PRN

## 2011-08-12 NOTE — Telephone Encounter (Signed)
Scheduled appt for pt with coughing, wheezing and nausea with Dr. Fabian Sharp.

## 2011-08-12 NOTE — Progress Notes (Signed)
  Subjective:    Patient ID: Gregg Hamilton, male    DOB: 11-16-1960, 50 y.o.   MRN: [id number]  HPI Patient comes in today for SDA  For acute problem evaluation. Cough hoarseness cough and congestion   For about 1 week.  No fever.  Cough now is dry and taking mucinex and this am had bad coughing spell. Poss wheeze or stridor .  No cp sob otherwise.   No hx of asthma some allergy.   No tobacco exposures HH of 2  . 2 grandchildren 77-59  Years old.  Ht on aceI  Review of Systems NO fever cp sob had a wheeze  Bad cough spasm this am   No nv some loose stools s  Past history family history social history reviewed in the electronic medical record.      Objective:   Physical Exam WDWN in NAD  quiet respirations; mildly congested  somewhat hoarse. Non toxic . HEENT: Normocephalic ;atraumatic , Eyes;  PERRL, EOMs  Full, lids and conjunctiva clear,,Ears: no deformities, canals nl, TM landmarks normal, Nose: no deformity or discharge but congested;face minimally tender Mouth : OP clear without lesion or edema . Tonsil 1 + no exudate  Neck: Supple without adenopathy or masses or bruits Chest:  Clear to A&P without wheezes rales or rhonchi CV:  S1-S2 no gallops or murmurs peripheral perfusion is normal Skin :nl perfusion and no acute rashes      Assessment & Plan:  Cough, laryngitis   prob viral croup like  And cw community illnesses.   Expectant management. And rx   HT good today  Disc cough prolongation sometimes .  With acei ? About b12 shots  He had  In the past  Advise recheck with PCP

## 2011-08-12 NOTE — Patient Instructions (Addendum)
This acts like a viral infection causing laryngitis and bronchial infection.   and could last another  Week or so and resolve on its own. If cough is  persistent or progressive  See Dr Caryl Never   BP med can contribute . Cough med is for comfort   Get back with dr Caryl Never about the b12 issue.

## 2011-11-26 ENCOUNTER — Other Ambulatory Visit: Payer: Self-pay | Admitting: Family Medicine

## 2011-12-16 ENCOUNTER — Encounter: Payer: Self-pay | Admitting: Family Medicine

## 2011-12-17 ENCOUNTER — Ambulatory Visit (INDEPENDENT_AMBULATORY_CARE_PROVIDER_SITE_OTHER): Payer: BC Managed Care – PPO | Admitting: Family Medicine

## 2011-12-17 ENCOUNTER — Encounter: Payer: Self-pay | Admitting: Family Medicine

## 2011-12-17 VITALS — BP 130/70 | Temp 97.8°F | Wt 250.0 lb

## 2011-12-17 DIAGNOSIS — I10 Essential (primary) hypertension: Secondary | ICD-10-CM

## 2011-12-17 DIAGNOSIS — E785 Hyperlipidemia, unspecified: Secondary | ICD-10-CM

## 2011-12-17 DIAGNOSIS — N529 Male erectile dysfunction, unspecified: Secondary | ICD-10-CM

## 2011-12-17 DIAGNOSIS — E538 Deficiency of other specified B group vitamins: Secondary | ICD-10-CM

## 2011-12-17 DIAGNOSIS — J309 Allergic rhinitis, unspecified: Secondary | ICD-10-CM

## 2011-12-17 DIAGNOSIS — F329 Major depressive disorder, single episode, unspecified: Secondary | ICD-10-CM

## 2011-12-17 MED ORDER — SERTRALINE HCL 50 MG PO TABS: 50.0000 mg | ORAL_TABLET | Freq: Every day | ORAL | Status: DC

## 2011-12-17 MED ORDER — LISINOPRIL-HYDROCHLOROTHIAZIDE 20-12.5 MG PO TABS: 1.0000 | ORAL_TABLET | Freq: Every day | ORAL | Status: DC

## 2011-12-17 MED ORDER — DESLORATADINE 5 MG PO TABS: 5.0000 mg | ORAL_TABLET | ORAL | Status: DC | PRN

## 2011-12-17 MED ORDER — SILDENAFIL CITRATE 100 MG PO TABS: 100.0000 mg | ORAL_TABLET | ORAL | Status: AC | PRN

## 2011-12-17 MED ORDER — EPINEPHRINE 0.3 MG/0.3ML IJ DEVI: 0.3000 mg | Freq: Once | INTRAMUSCULAR | Status: DC

## 2011-12-17 NOTE — Patient Instructions (Signed)
Try to establish regular exercise lose some weight.

## 2011-12-17 NOTE — Progress Notes (Signed)
  Subjective:    Patient ID: Gregg Hamilton, male    DOB: [birth year], 51 y.o.   MRN: [id number]  HPI  Patient is in for medical followup. He has history of seasonal and perennial allergies, hypertension, depression, and reported B12 deficiency. Not been taking B12 for several months. No recent levels. He had some new issues with erectile dysfunction. Requesting Viagra. No history of nitroglycerin use. Fair libido.  Blood pressure sometimes elevated by home readings but not consistently. No headaches or dizziness. No chest pain. Compliant with all medications. Depression stable on sertraline 50 mg daily. Allergies controlled fairly well with Clarinex.  He has history of anaphylactic reaction to shellfish. Needs refill of EpiPen.  He thinks he has had hyperlipidemia previously but has not had lipids in several years. Has family history of type 2 diabetes and needs fasting labs  Past Medical History  Diagnosis Date  . Depression   . Hypertension   . Allergy   . Heart murmur    Past Surgical History  Procedure Date  . Vasectomy     reports that he has never smoked. He does not have any smokeless tobacco history on file. He reports that he drinks alcohol. He reports that he does not use illicit drugs. family history includes Cancer in his other; Diabetes in his mother; Heart disease in his mother; and Hypertension in his other. Allergies  Allergen Reactions  . Shellfish Allergy Anaphylaxis      Review of Systems  Constitutional: Negative for fatigue.  Eyes: Negative for visual disturbance.  Respiratory: Negative for cough, chest tightness and shortness of breath.   Cardiovascular: Negative for chest pain, palpitations and leg swelling.  Neurological: Negative for dizziness, syncope, weakness, light-headedness and headaches.       Objective:   Physical Exam  Constitutional: He is oriented to person, place, and time. He appears well-developed and well-nourished. No distress.    HENT:  Mouth/Throat: Oropharynx is clear and moist.  Neck: Neck supple. No thyromegaly present.  Cardiovascular: Normal rate and regular rhythm.   Pulmonary/Chest: Breath sounds normal. No respiratory distress. He has no wheezes. He has no rales.  Musculoskeletal: He exhibits no edema.  Lymphadenopathy:    He has no cervical adenopathy.  Neurological: He is alert and oriented to person, place, and time.  Psychiatric: He has a normal mood and affect. His behavior is normal. Thought content normal.          Assessment & Plan:  #1 hypertension. Stable. Needs to work on weight loss and establishing more regular exercise. Refill lisinopril HCTZ. Schedule lab with basal metabolic panel #2 history of hyperlipidemia. Schedule fasting lipid panel and hepatic panel #3 history of low B12. Check B12 level and labs. Get back on replacement if low #4 history of depression stable refill sertraline for one year #5 perennial allergies. Refill Clarinex #6 erectile dysfunction which is a new problem. Trial of Viagra 100 mg one half to one tablet daily as needed

## 2011-12-19 DIAGNOSIS — E785 Hyperlipidemia, unspecified: Secondary | ICD-10-CM | POA: Insufficient documentation

## 2011-12-19 DIAGNOSIS — E538 Deficiency of other specified B group vitamins: Secondary | ICD-10-CM | POA: Insufficient documentation

## 2012-01-11 ENCOUNTER — Other Ambulatory Visit (INDEPENDENT_AMBULATORY_CARE_PROVIDER_SITE_OTHER): Payer: BC Managed Care – PPO

## 2012-01-11 DIAGNOSIS — E538 Deficiency of other specified B group vitamins: Secondary | ICD-10-CM

## 2012-01-11 DIAGNOSIS — I10 Essential (primary) hypertension: Secondary | ICD-10-CM

## 2012-01-11 LAB — BASIC METABOLIC PANEL
BUN: 18 mg/dL (ref 6–23)
Calcium: 9.4 mg/dL (ref 8.4–10.5)
GFR: 89.5 mL/min (ref >60.00)
Potassium: 4.6 mEq/L (ref 3.5–5.1)
Sodium: 139 mEq/L (ref 135–145)

## 2012-01-11 LAB — HEPATIC FUNCTION PANEL
ALT: 45 U/L (ref 0–53)
Albumin: 4.3 g/dL (ref 3.5–5.2)
Alkaline Phosphatase: 69 U/L (ref 39–117)
Bilirubin, Direct: 0 mg/dL (ref 0.0–0.3)
Total Protein: 7.6 g/dL (ref 6.0–8.3)

## 2012-01-11 LAB — LIPID PANEL
Cholesterol: 372 mg/dL — ABNORMAL HIGH (ref 0–200)
VLDL: 87 mg/dL — ABNORMAL HIGH (ref 0.0–40.0)

## 2012-01-12 ENCOUNTER — Other Ambulatory Visit: Payer: Self-pay | Admitting: Family Medicine

## 2012-01-12 DIAGNOSIS — E7800 Pure hypercholesterolemia, unspecified: Secondary | ICD-10-CM

## 2012-01-12 MED ORDER — ATORVASTATIN CALCIUM 20 MG PO TABS: 20.0000 mg | ORAL_TABLET | Freq: Every day | ORAL | Status: DC

## 2012-01-12 NOTE — Progress Notes (Signed)
Quick Note:  Pt informed on personally identified VM, labs mailed to home with instructions highlighted, med sent to pharmacy, future labs ordered ______

## 2012-07-18 ENCOUNTER — Telehealth: Payer: Self-pay | Admitting: *Deleted

## 2012-07-18 NOTE — Telephone Encounter (Signed)
Pt is not taking Lipitor, has not been taking for some time already.

## 2012-07-18 NOTE — Telephone Encounter (Signed)
Message copied by Melchor Amour on Tue Jul 18, 2012  1:30 PM ------      Message from: Kristian Covey      Created: Tue Jul 18, 2012  6:35 AM       This patient needs follow up lipid and hepatic- if still taking Lipitor

## 2012-11-15 ENCOUNTER — Encounter: Payer: BC Managed Care – PPO | Admitting: Family Medicine

## 2012-11-20 ENCOUNTER — Other Ambulatory Visit (INDEPENDENT_AMBULATORY_CARE_PROVIDER_SITE_OTHER): Payer: BC Managed Care – PPO

## 2012-11-20 DIAGNOSIS — Z0000 Encounter for general adult medical examination without abnormal findings: Secondary | ICD-10-CM

## 2012-11-20 DIAGNOSIS — E7800 Pure hypercholesterolemia, unspecified: Secondary | ICD-10-CM

## 2012-11-20 LAB — CBC WITH DIFFERENTIAL/PLATELET
Basophils Absolute: 0 10*3/uL (ref 0.0–0.1)
Eosinophils Absolute: 0.1 10*3/uL (ref 0.0–0.7)
HCT: 39.7 % (ref 39.0–52.0)
Hemoglobin: 13.5 g/dL (ref 13.0–17.0)
Lymphs Abs: 2.2 10*3/uL (ref 0.7–4.0)
MCHC: 34.1 g/dL (ref 30.0–36.0)
MCV: 86.1 fl (ref 78.0–100.0)
Neutro Abs: 3.9 10*3/uL (ref 1.4–7.7)
RDW: 13.3 % (ref 11.5–14.6)

## 2012-11-20 LAB — BASIC METABOLIC PANEL
CO2: 30 mEq/L (ref 19–32)
Glucose, Bld: 93 mg/dL (ref 70–99)
Potassium: 4.6 mEq/L (ref 3.5–5.1)
Sodium: 140 mEq/L (ref 135–145)

## 2012-11-20 LAB — POCT URINALYSIS DIPSTICK
Protein, UA: NEGATIVE
Spec Grav, UA: <=1.005
Urobilinogen, UA: 0.2

## 2012-11-20 LAB — LDL CHOLESTEROL, DIRECT: Direct LDL: 115.1 mg/dL

## 2012-11-20 LAB — TSH: TSH: 2.4 u[IU]/mL (ref 0.35–5.50)

## 2012-11-20 LAB — HEPATIC FUNCTION PANEL
AST: 23 U/L (ref 0–37)
Albumin: 4.1 g/dL (ref 3.5–5.2)

## 2012-11-23 ENCOUNTER — Ambulatory Visit (INDEPENDENT_AMBULATORY_CARE_PROVIDER_SITE_OTHER): Payer: BC Managed Care – PPO | Admitting: Family Medicine

## 2012-11-23 ENCOUNTER — Encounter: Payer: Self-pay | Admitting: Family Medicine

## 2012-11-23 VITALS — BP 120/70 | HR 72 | Temp 96.9°F | Resp 12 | Ht 68.75 in | Wt 250.0 lb

## 2012-11-23 DIAGNOSIS — T781XXA Other adverse food reactions, not elsewhere classified, initial encounter: Secondary | ICD-10-CM

## 2012-11-23 DIAGNOSIS — E8881 Metabolic syndrome: Secondary | ICD-10-CM

## 2012-11-23 DIAGNOSIS — J309 Allergic rhinitis, unspecified: Secondary | ICD-10-CM

## 2012-11-23 DIAGNOSIS — Z0000 Encounter for general adult medical examination without abnormal findings: Secondary | ICD-10-CM

## 2012-11-23 MED ORDER — SERTRALINE HCL 50 MG PO TABS: 50.0000 mg | ORAL_TABLET | Freq: Every day | ORAL | Status: DC

## 2012-11-23 MED ORDER — LISINOPRIL-HYDROCHLOROTHIAZIDE 20-12.5 MG PO TABS: 1.0000 | ORAL_TABLET | Freq: Every day | ORAL | Status: DC

## 2012-11-23 MED ORDER — DESLORATADINE 5 MG PO TABS: 5.0000 mg | ORAL_TABLET | ORAL | Status: DC | PRN

## 2012-11-23 NOTE — Patient Instructions (Signed)
Metabolic Syndrome, Adult Metabolic syndrome descibes a group of risk factors for heart disease and diabetes. This syndrome has other names including Insulin Resistance Syndrome. The more risk factors you have, the higher your risk of having a heart attack, stroke, or developing diabetes. These risk factors include:  High blood sugar.  High blood triglyceride (a fat found in the blood) level.  High blood pressure.  Abdominal obesity (your extra weight is around your waist instead of your hips).  Low levels of high-density lipoprotein, HDL (good blood cholesterol). If you have any three of these risk factors, you have metabolic syndrome. If you have even one of these factors, you should make lifestyle changes to improve your health in order to prevent serious health diseases.  In people with metabolic syndrome, the cells do not respond properly to insulin. This can lead to high levels of glucose in the blood, which can interfere with normal body processes. Eventually, this can cause high blood pressure and higher fat levels in the blood, and inflammation of your blood vessels. The result can be heart disease and stroke.  CAUSES   Eating a diet rich in calories and saturated fat.  Too little physical activity.  Being overweight. Other underlying causes are:  Family history (genetics).  Ethnicity (South Asians are at a higher risk).  Older age (your chances of developing metabolic syndrome are higher as you grow older).  Insulin resistance. SYMPTOMS  By itself, metabolic syndrome has no symptoms. However, you might have symptoms of diabetes (high blood sugar) or high blood pressure, such as:  Increased thirst, urination, and tiredness.  Dizzy spells.  Dull headaches that are unusual for you.  Blurred vision.  Nosebleeds. DIAGNOSIS  Your caregiver may make a diagnosis of metabolic syndrome if you have at least three of these factors:  If you are overweight mostly around the  waist. This means a waistline greater than 40" in men and more than 35" in women. The waistline limits are 31 to 35 inches for women and 37 to 39 inches for men. In those who have certain genetic risk factors, such as having a family history of diabetes or being of Asian descent.  If you have a blood pressure of 130/85 mm Hg or more, or if you are being treated for high blood pressure.  If your blood triglyceride level is 150 mg/dL or more, or you are being treated for high levels of triglyceride.  If the level of HDL in your blood is below 40 mg/dL in men, less than 50 mg/dL in women, or you are receiving treatment for low levels of HDL.  If the level of sugar in your blood is high with fasting blood sugar level of 110 mg/dL or more, or you are under treatment for diabetes. TREATMENT  Your caregiver may have you make lifestyle changes, which may include:  Exercise.  Losing weight.  Maintaining a healthy diet.  Quitting smoking. The lifestyle changes listed above are key in reducing your risk for heart disease and stroke. Medicines may also be prescribed to help your body respond to insulin better and to reduce your blood pressure and blood fat levels. Aspirin may be recommended to reduce risks of heart disease or stroke.  HOME CARE INSTRUCTIONS   Exercise.  Measure your waist at regular intervals just above the hipbones after you have breathed out.  Maintain a healthy diet.  Eat fruits, such as apples, oranges, and pears.  Eat vegetables.  Eat legumes, such as kidney   beans, peas, and lentils.  Eat food rich in soluble fiber, such as whole grain cereal, oatmeal, and oat bran.  Use olive or safflower oils and avoid saturated fats.  Eat nuts.  Limit the amount of salt you eat or add to food.  Limit the amount of alcohol you drink.  Include fish in your diet, if possible.  Stop smoking if you are a smoker.  Maintain regular follow-up appointments.  Follow your  caregiver's advice. SEEK MEDICAL CARE IF:   You feel very tired or fatigued.  You develop excessive thirst.  You pass large quantities of urine.  You are putting on weight around your waist rather than losing weight.  You develop headaches over and over again.  You have off-and-on dizzy spells. SEEK IMMEDIATE MEDICAL CARE IF:   You develop nosebleeds.  You develop sudden blurred vision.  You develop sudden dizzy spells.  You develop chest pains, trouble breathing, or feel an abnormal or irregular heart beat.  You have a fainting episode.  You develop any sudden trouble speaking and/or swallowing.  You develop sudden weakness in one arm and/or one leg. MAKE SURE YOU:   Understand these instructions.  Will watch your condition.  Will get help right away if you are not doing well or get worse. Document Released: 12/21/2007 Document Revised: 12/06/2011 Document Reviewed: 12/21/2007 Exit[employer] Patient Information 2013 [employer].  

## 2012-11-23 NOTE — Progress Notes (Signed)
Subjective:    Patient ID: Gregg Hamilton, male    DOB: [birth year], 52 y.o.   MRN: [id number]  HPI Patient seen for complete physical. Has history of metabolic syndrome with obesity, hypertension, high triglycerides, and low HDL. Last year his cholesterol was 372 and we prescribed Lipitor. He never took this and made some dietary changes. Cholesterol is greatly improved this year. Blood pressure well controlled with lisinopril HCTZ.  Depression controlled with sertraline. He has question of several food allergies and also seasonal allergies. Requesting referral to allergist. Question of shellfish allergy.  Last tetanus 2008. He had colonoscopy 2011 which was normal.  Family history reviewed and as below. Both parents had type 2 diabetes.  Past Medical History  Diagnosis Date  . Depression   . Hypertension   . Allergy   . Heart murmur    Past Surgical History  Procedure Laterality Date  . Vasectomy      reports that he has never smoked. He does not have any smokeless tobacco history on file. He reports that  drinks alcohol. He reports that he does not use illicit drugs. family history includes Cancer in his other; Diabetes in his mother; Heart disease in his mother; and Hypertension in his other. Allergies  Allergen Reactions  . Shellfish Allergy Anaphylaxis      Review of Systems  Constitutional: Negative for fever, activity change, appetite change and fatigue.  HENT: Negative for ear pain, congestion and trouble swallowing.   Eyes: Negative for pain and visual disturbance.  Respiratory: Negative for cough, shortness of breath and wheezing.   Cardiovascular: Negative for chest pain and palpitations.  Gastrointestinal: Negative for nausea, vomiting, abdominal pain, diarrhea, constipation, blood in stool, abdominal distention and rectal pain.  Genitourinary: Negative for dysuria, hematuria and testicular pain.  Musculoskeletal: Negative for joint swelling and arthralgias.   Skin: Negative for rash.  Allergic/Immunologic: Positive for environmental allergies and food allergies. Negative for immunocompromised state.  Neurological: Negative for dizziness, syncope and headaches.  Hematological: Negative for adenopathy.  Psychiatric/Behavioral: Negative for confusion and dysphoric mood.       Objective:   Physical Exam  Constitutional: He is oriented to person, place, and time. He appears well-developed and well-nourished. No distress.  HENT:  Head: Normocephalic and atraumatic.  Right Ear: External ear normal.  Left Ear: External ear normal.  Mouth/Throat: Oropharynx is clear and moist.  Eyes: Conjunctivae and EOM are normal. Pupils are equal, round, and reactive to light.  Neck: Normal range of motion. Neck supple. No thyromegaly present.  Cardiovascular: Normal rate, regular rhythm and normal heart sounds.   No murmur heard. Pulmonary/Chest: No respiratory distress. He has no wheezes. He has no rales.  Abdominal: Soft. Bowel sounds are normal. He exhibits no distension and no mass. There is no tenderness. There is no rebound and no guarding.  Musculoskeletal: He exhibits no edema.  Lymphadenopathy:    He has no cervical adenopathy.  Neurological: He is alert and oriented to person, place, and time. He displays normal reflexes. No cranial nerve deficit.  Skin: No rash noted.  Psychiatric: He has a normal mood and affect.          Assessment & Plan:  Complete physical. Patient has had tremendous improvement in lipids with dietary changes. He still needs to lose some weight. Tetanus up-to-date. Colonoscopy up to date. He has metabolic syndrome and we discussed importance of this. He has high risk for type 2 diabetes. Start more consistent exercise.  Environmental and  food allergies. Patient requesting allergy referral. We'll set up

## 2012-12-14 ENCOUNTER — Other Ambulatory Visit: Payer: BC Managed Care – PPO

## 2012-12-20 ENCOUNTER — Encounter: Payer: BC Managed Care – PPO | Admitting: Family Medicine

## 2013-11-21 ENCOUNTER — Other Ambulatory Visit: Payer: Self-pay | Admitting: Family Medicine

## 2013-12-08 ENCOUNTER — Other Ambulatory Visit: Payer: Self-pay | Admitting: Family Medicine

## 2014-02-13 ENCOUNTER — Other Ambulatory Visit: Payer: Self-pay | Admitting: Family Medicine

## 2014-03-21 ENCOUNTER — Telehealth: Payer: Self-pay | Admitting: Family Medicine

## 2014-03-21 MED ORDER — LISINOPRIL-HYDROCHLOROTHIAZIDE 20-12.5 MG PO TABS: ORAL_TABLET | ORAL | Status: DC

## 2014-03-21 NOTE — Telephone Encounter (Signed)
Pt had to be resc on dr burchette's Friday afternoon, but was coming in for med fup. Now he is out of meds today. Can we send 30 day refill  lisinopril-hydrochlorothiazide (PRINZIDE,ZESTORETIC) 20-12.5 MG per tablet To [facility] [address]   Thanks!

## 2014-03-21 NOTE — Telephone Encounter (Signed)
RX sent to pharmacy  

## 2014-03-22 ENCOUNTER — Ambulatory Visit: Payer: BC Managed Care – PPO | Admitting: Family Medicine

## 2014-04-01 ENCOUNTER — Ambulatory Visit: Payer: BC Managed Care – PPO | Admitting: Family Medicine

## 2014-04-02 ENCOUNTER — Encounter: Payer: Self-pay | Admitting: Family Medicine

## 2014-04-02 ENCOUNTER — Telehealth: Payer: Self-pay | Admitting: Family Medicine

## 2014-04-02 ENCOUNTER — Ambulatory Visit (INDEPENDENT_AMBULATORY_CARE_PROVIDER_SITE_OTHER): Payer: BC Managed Care – PPO | Admitting: Family Medicine

## 2014-04-02 VITALS — BP 132/82 | HR 78 | Temp 97.9°F | Wt 260.0 lb

## 2014-04-02 DIAGNOSIS — E538 Deficiency of other specified B group vitamins: Secondary | ICD-10-CM

## 2014-04-02 DIAGNOSIS — E785 Hyperlipidemia, unspecified: Secondary | ICD-10-CM

## 2014-04-02 DIAGNOSIS — E8881 Metabolic syndrome: Secondary | ICD-10-CM

## 2014-04-02 DIAGNOSIS — I10 Essential (primary) hypertension: Secondary | ICD-10-CM

## 2014-04-02 LAB — LIPID PANEL
CHOL/HDL RATIO: 7
Cholesterol: 290 mg/dL — ABNORMAL HIGH (ref 0–200)
HDL: 42.9 mg/dL (ref >39.00)
LDL Cholesterol: 171 mg/dL — ABNORMAL HIGH (ref 0–99)
NONHDL: 247.1
Triglycerides: 382 mg/dL — ABNORMAL HIGH (ref 0.0–149.0)
VLDL: 76.4 mg/dL — ABNORMAL HIGH (ref 0.0–40.0)

## 2014-04-02 LAB — BASIC METABOLIC PANEL
BUN: 14 mg/dL (ref 6–23)
CHLORIDE: 103 meq/L (ref 96–112)
CO2: 26 mEq/L (ref 19–32)
CREATININE: 1 mg/dL (ref 0.4–1.5)
Calcium: 9.5 mg/dL (ref 8.4–10.5)
GFR: 83.59 mL/min (ref >60.00)
Glucose, Bld: 107 mg/dL — ABNORMAL HIGH (ref 70–99)
POTASSIUM: 4.7 meq/L (ref 3.5–5.1)
Sodium: 137 mEq/L (ref 135–145)

## 2014-04-02 LAB — VITAMIN B12: VITAMIN B 12: 236 pg/mL (ref 211–911)

## 2014-04-02 MED ORDER — LISINOPRIL-HYDROCHLOROTHIAZIDE 20-12.5 MG PO TABS: ORAL_TABLET | ORAL | Status: DC

## 2014-04-02 NOTE — Progress Notes (Signed)
Pre visit review using our clinic review tool, if applicable. No additional management support is needed unless otherwise documented below in the visit note. 

## 2014-04-02 NOTE — Progress Notes (Signed)
   Subjective:    Patient ID: Gregg Hamilton, male    DOB: 20-Sep-1961, 53 y.o.   MRN: [id number]  HPI Followup multiple medical problems.  Hypertension. Not monitored at home. Compliant with medication. Takes lisinopril HCTZ. No headaches. No dizziness.  History of B12 deficiency. He has been off injections for over one year. Not taking any oral replacement. No neuropathy symptoms. No weakness. Has not had repeat level in quite some time  Hyperlipidemia history. He has reduced cholesterol tremendously in the past with weight loss. Unfortunately, he has gained 10 pounds since last visit. Poor dietary compliance at times. Recent knee injury which has curtailed his exercise. He plans to start back more consistently soon No history of CAD or peripheral vascular disease  Past Medical History  Diagnosis Date  . Depression   . Hypertension   . Allergy   . Heart murmur   . Metabolic syndrome    Past Surgical History  Procedure Laterality Date  . Vasectomy      reports that he has never smoked. He does not have any smokeless tobacco history on file. He reports that he drinks alcohol. He reports that he does not use illicit drugs. family history includes Cancer in his other; Diabetes in his mother; Heart disease in his mother; Hypertension in his other. Allergies  Allergen Reactions  . Shellfish Allergy Anaphylaxis      Review of Systems  Constitutional: Negative for fatigue and unexpected weight change.  Eyes: Negative for visual disturbance.  Respiratory: Negative for cough, chest tightness and shortness of breath.   Cardiovascular: Negative for chest pain, palpitations and leg swelling.  Endocrine: Negative for polydipsia and polyuria.  Neurological: Negative for dizziness, syncope, weakness, light-headedness and headaches.       Objective:   Physical Exam  Constitutional: He appears well-developed and well-nourished. No distress.  HENT:  Head: Normocephalic and atraumatic.    Eyes: Conjunctivae and EOM are normal. Pupils are equal, round, and reactive to light.  Neck: Normal range of motion. Neck supple. No thyromegaly present.  Cardiovascular: Normal rate, regular rhythm and normal heart sounds.   No murmur heard. Pulmonary/Chest: No respiratory distress. He has no wheezes. He has no rales.  Musculoskeletal: He exhibits no edema.  Lymphadenopathy:    He has no cervical adenopathy.          Assessment & Plan:  #1 hypertension. Adequate control. Needs to lose some weight-strategies discussed. Check basic metabolic panel. Refill medication for one year #2 history B12 deficiency. Repeat B12 level. Consider oral replacement trial if still low #3 hyperlipidemia. Repeat fasting lipid panel

## 2014-04-02 NOTE — Patient Instructions (Signed)
Try to lose some weight. We will call you with lab results. 

## 2014-04-02 NOTE — Telephone Encounter (Signed)
Relevant patient education assigned to patient using Emmi. ° °

## 2014-04-03 ENCOUNTER — Other Ambulatory Visit: Payer: Self-pay | Admitting: Family Medicine

## 2014-04-03 DIAGNOSIS — E785 Hyperlipidemia, unspecified: Secondary | ICD-10-CM

## 2014-04-03 DIAGNOSIS — E538 Deficiency of other specified B group vitamins: Secondary | ICD-10-CM

## 2014-05-08 ENCOUNTER — Encounter: Payer: Self-pay | Admitting: Gastroenterology

## 2014-10-09 ENCOUNTER — Ambulatory Visit: Payer: BC Managed Care – PPO | Admitting: Family Medicine

## 2014-10-21 ENCOUNTER — Encounter: Payer: Self-pay | Admitting: Family Medicine

## 2014-10-21 ENCOUNTER — Ambulatory Visit (INDEPENDENT_AMBULATORY_CARE_PROVIDER_SITE_OTHER): Payer: BLUE CROSS/BLUE SHIELD | Admitting: Family Medicine

## 2014-10-21 VITALS — BP 130/80 | HR 80 | Wt 251.0 lb

## 2014-10-21 DIAGNOSIS — E538 Deficiency of other specified B group vitamins: Secondary | ICD-10-CM

## 2014-10-21 DIAGNOSIS — E785 Hyperlipidemia, unspecified: Secondary | ICD-10-CM

## 2014-10-21 DIAGNOSIS — I10 Essential (primary) hypertension: Secondary | ICD-10-CM

## 2014-10-21 LAB — LIPID PANEL
CHOL/HDL RATIO: 5
CHOLESTEROL: 209 mg/dL — AB (ref 0–200)
HDL: 41.6 mg/dL (ref >39.00)
NonHDL: 167.4
Triglycerides: 289 mg/dL — ABNORMAL HIGH (ref 0.0–149.0)
VLDL: 57.8 mg/dL — AB (ref 0.0–40.0)

## 2014-10-21 LAB — LDL CHOLESTEROL, DIRECT: Direct LDL: 106 mg/dL

## 2014-10-21 LAB — VITAMIN B12: Vitamin B-12: 267 pg/mL (ref 211–911)

## 2014-10-21 NOTE — Progress Notes (Signed)
   Subjective:    Patient ID: Gregg Hamilton, male    DOB: 1960-11-29, 54 y.o.   MRN: [id number]  HPI Patient here for follow-up regarding medical problems. He has history of B12 deficiency, hypertension, dyslipidemia. He has lost about 9 pounds since last year. He has been reluctant to take statins in the past but has not had any specific known intolerance. Nonsmoker. Remains on lisinopril HCTZ for hypertension. No headaches. No dizziness. No chest pains. He's taking oral B12 replacement 1000 g daily. Compliant with medications. Allergies controlled with Xyzal.  Past Medical History  Diagnosis Date  . Depression   . Hypertension   . Allergy   . Heart murmur   . Metabolic syndrome    Past Surgical History  Procedure Laterality Date  . Vasectomy      reports that he has never smoked. He does not have any smokeless tobacco history on file. He reports that he drinks alcohol. He reports that he does not use illicit drugs. family history includes Cancer in his other; Diabetes in his mother; Heart disease in his mother; Hypertension in his other. Allergies  Allergen Reactions  . Shellfish Allergy Anaphylaxis      Review of Systems  Constitutional: Negative for fatigue.  Eyes: Negative for visual disturbance.  Respiratory: Negative for cough, chest tightness and shortness of breath.   Cardiovascular: Negative for chest pain, palpitations and leg swelling.  Endocrine: Negative for polydipsia and polyuria.  Neurological: Negative for dizziness, syncope, weakness, light-headedness and headaches.       Objective:   Physical Exam  Constitutional: He appears well-developed and well-nourished. No distress.  Neck: Neck supple. No thyromegaly present.  Cardiovascular: Normal rate and regular rhythm.   Pulmonary/Chest: Effort normal and breath sounds normal. No respiratory distress. He has no wheezes. He has no rales.  Musculoskeletal: He exhibits no edema.  Lymphadenopathy:    He has no  cervical adenopathy.          Assessment & Plan:  #1 hypertension. Adequate control. Continue lisinopril HCTZ #2 dyslipidemia. Repeat lipid panel. Consider statin if cholesterol remains elevated  #3 B12 deficiency. Recheck B12 levels.

## 2014-10-21 NOTE — Progress Notes (Signed)
Pre visit review using our clinic review tool, if applicable. No additional management support is needed unless otherwise documented below in the visit note. 

## 2015-03-10 ENCOUNTER — Telehealth: Payer: Self-pay

## 2015-03-10 MED ORDER — LISINOPRIL-HYDROCHLOROTHIAZIDE 20-12.5 MG PO TABS: ORAL_TABLET | ORAL | Status: DC

## 2015-03-10 NOTE — Telephone Encounter (Signed)
[facility] - [place], [place] - [address]: lisinopril-hydrochlorothiazide (PRINZIDE,ZESTORETIC) 20-12.5 MG per tablet

## 2015-03-28 ENCOUNTER — Telehealth: Payer: Self-pay | Admitting: Family Medicine

## 2015-03-28 NOTE — Telephone Encounter (Signed)
Spoke with MD Durene CalHunter. He states to educate patient if BP drops below 100/60 to give us a call. Also educated patient regardless of BP, if he has any symptoms of dizziness, light-headedness, new onset of fatigue or weakness to give us a call because those symptoms can signify low BP. Patient verbalized understanding and states he goes to [employer] near his house everyday to check his BP and the lowest it has been is 107/78. He is asymptomatic and feels good.

## 2015-03-28 NOTE — Telephone Encounter (Signed)
[facility] - Client TELEPHONE ADVICE RECORD [facility]  Patient Name: Gregg Hamilton  DOB: 08-05-1961    Initial Comment Caller states he has been dieting for 6-7 weeks. Has lost 33 lbs. Concerned about taking his BP medication.    Nurse Assessment      Guidelines    Guideline Title Affirmed Question Affirmed Notes       Final Disposition User   FINAL ATTEMPT MADE - no message left Laural BenesJohnson, Charity fundraiserN, Dondra SpryGail

## 2015-03-28 NOTE — Telephone Encounter (Signed)
Left message for patient to call back  

## 2015-04-10 ENCOUNTER — Other Ambulatory Visit: Payer: Self-pay | Admitting: Family Medicine

## 2016-04-20 ENCOUNTER — Other Ambulatory Visit: Payer: Self-pay | Admitting: Family Medicine

## 2016-04-20 NOTE — Telephone Encounter (Signed)
Agree needs to be seen.  May refill once until follow up.

## 2016-04-28 ENCOUNTER — Other Ambulatory Visit: Payer: Self-pay | Admitting: Family Medicine

## 2016-04-30 ENCOUNTER — Ambulatory Visit (INDEPENDENT_AMBULATORY_CARE_PROVIDER_SITE_OTHER): Payer: BLUE CROSS/BLUE SHIELD | Admitting: Family Medicine

## 2016-04-30 ENCOUNTER — Encounter: Payer: Self-pay | Admitting: Family Medicine

## 2016-04-30 VITALS — BP 110/80 | HR 92 | Temp 98.0°F | Ht 68.75 in | Wt 249.0 lb

## 2016-04-30 DIAGNOSIS — L723 Sebaceous cyst: Secondary | ICD-10-CM

## 2016-04-30 DIAGNOSIS — Z1211 Encounter for screening for malignant neoplasm of colon: Secondary | ICD-10-CM

## 2016-04-30 DIAGNOSIS — I10 Essential (primary) hypertension: Secondary | ICD-10-CM | POA: Diagnosis not present

## 2016-04-30 MED ORDER — LISINOPRIL-HYDROCHLOROTHIAZIDE 20-12.5 MG PO TABS: 1.0000 | ORAL_TABLET | Freq: Every day | ORAL | 3 refills | Status: DC

## 2016-04-30 NOTE — Progress Notes (Signed)
Pre visit review using our clinic review tool, if applicable. No additional management support is needed unless otherwise documented below in the visit note. 

## 2016-04-30 NOTE — Patient Instructions (Signed)
Consider setting up complete physical We will initiate Cologuard.

## 2016-04-30 NOTE — Progress Notes (Signed)
Subjective:     Patient ID: Gregg Hamilton, male   DOB: 11/03/1960, 55 y.o.   MRN: [id number]  HPI Patient is here for the following issues  Hypertension treated with lisinopril HCTZ. Has not been seen here in several months. Compliant with medication. No headaches, dizziness, or chest pains. Does not monitor blood pressures regularly.  Patient requesting DNA stool testing with Cologuard. Had normal colonoscopy back in March 2011 and was recommended for ten year follow-up. However, has had a couple episodes in recent months of some bright red blood per rectum. He had a friend recently who went back for early interval colonoscopy and had early colon cancer that was his concern. He has not had any change in shapes stools or change in stool habits. No appetite or weight changes.  Cyst mid thoracic back which recently started draining. Nonpainful. No fevers or chills. He's noted the cyst several years ago. Frequently drains foul-smelling odor  Past Medical History:  Diagnosis Date  . Allergy   . Depression   . Heart murmur   . Hypertension   . Metabolic syndrome    Past Surgical History:  Procedure Laterality Date  . VASECTOMY      reports that he has never smoked. He does not have any smokeless tobacco history on file. He reports that he drinks alcohol. He reports that he does not use drugs. family history includes Cancer in his other; Diabetes in his mother; Heart disease in his mother; Hypertension in his other. Allergies  Allergen Reactions  . Shellfish Allergy Anaphylaxis      Review of Systems  Constitutional: Negative for appetite change, fatigue and unexpected weight change.  Eyes: Negative for visual disturbance.  Respiratory: Negative for cough, chest tightness and shortness of breath.   Cardiovascular: Negative for chest pain, palpitations and leg swelling.  Gastrointestinal: Negative for abdominal pain, constipation and diarrhea.  Neurological: Negative for  dizziness, syncope, weakness, light-headedness and headaches.       Objective:   Physical Exam  Constitutional: He appears well-developed and well-nourished.  Neck: Neck supple. No thyromegaly present.  Cardiovascular: Normal rate and regular rhythm.   Pulmonary/Chest: Effort normal and breath sounds normal. No respiratory distress. He has no wheezes. He has no rales.  Musculoskeletal: He exhibits no edema.  Skin:  Just right of mid thoracic region he has slightly fluctuant cyst with no overlying erythema. Nontender.       Assessment:     #1 hypertension. Stable and at goal  #2 patient requesting early interval colon cancer screening. He had colonoscopy as above less than 10 years ago which was normal. He is requesting DNA stool testing  #3 sebaceous cyst mid back. Does not have any cellulitis changes    Plan:     -Refill blood pressure medication for one year -Recommend set up complete physical -Initiated order for Cologuard-per his request. -Consider excision of sebaceous cyst in future if this is starting to bother him more  Kristian Covey MD [facility] at [facility]

## 2016-05-16 DIAGNOSIS — Z1212 Encounter for screening for malignant neoplasm of rectum: Secondary | ICD-10-CM | POA: Diagnosis not present

## 2016-05-16 DIAGNOSIS — Z1211 Encounter for screening for malignant neoplasm of colon: Secondary | ICD-10-CM | POA: Diagnosis not present

## 2016-05-16 LAB — COLOGUARD: Cologuard: NEGATIVE

## 2016-05-19 ENCOUNTER — Other Ambulatory Visit (INDEPENDENT_AMBULATORY_CARE_PROVIDER_SITE_OTHER): Payer: BLUE CROSS/BLUE SHIELD

## 2016-05-19 DIAGNOSIS — R7989 Other specified abnormal findings of blood chemistry: Secondary | ICD-10-CM | POA: Diagnosis not present

## 2016-05-19 DIAGNOSIS — Z0000 Encounter for general adult medical examination without abnormal findings: Secondary | ICD-10-CM | POA: Diagnosis not present

## 2016-05-19 LAB — BASIC METABOLIC PANEL
BUN: 14 mg/dL (ref 6–23)
CHLORIDE: 99 meq/L (ref 96–112)
CO2: 29 meq/L (ref 19–32)
Calcium: 9.4 mg/dL (ref 8.4–10.5)
Creatinine, Ser: 0.95 mg/dL (ref 0.40–1.50)
GFR: 86.99 mL/min (ref >60.00)
Glucose, Bld: 97 mg/dL (ref 70–99)
POTASSIUM: 4.5 meq/L (ref 3.5–5.1)
Sodium: 136 mEq/L (ref 135–145)

## 2016-05-19 LAB — HEPATIC FUNCTION PANEL
ALT: 23 U/L (ref 0–53)
AST: 15 U/L (ref 0–37)
Albumin: 4.5 g/dL (ref 3.5–5.2)
Alkaline Phosphatase: 67 U/L (ref 39–117)
BILIRUBIN TOTAL: 0.4 mg/dL (ref 0.2–1.2)
Bilirubin, Direct: 0.1 mg/dL (ref 0.0–0.3)
TOTAL PROTEIN: 7.3 g/dL (ref 6.0–8.3)

## 2016-05-19 LAB — CBC WITH DIFFERENTIAL/PLATELET
BASOS PCT: 0.5 % (ref 0.0–3.0)
Basophils Absolute: 0 10*3/uL (ref 0.0–0.1)
EOS PCT: 2.4 % (ref 0.0–5.0)
Eosinophils Absolute: 0.2 10*3/uL (ref 0.0–0.7)
HEMATOCRIT: 41.7 % (ref 39.0–52.0)
HEMOGLOBIN: 14.5 g/dL (ref 13.0–17.0)
Lymphocytes Relative: 30.4 % (ref 12.0–46.0)
Lymphs Abs: 2.1 10*3/uL (ref 0.7–4.0)
MCHC: 34.7 g/dL (ref 30.0–36.0)
MCV: 85.1 fl (ref 78.0–100.0)
MONO ABS: 0.5 10*3/uL (ref 0.1–1.0)
Monocytes Relative: 7.1 % (ref 3.0–12.0)
Neutro Abs: 4.1 10*3/uL (ref 1.4–7.7)
Neutrophils Relative %: 59.6 % (ref 43.0–77.0)
Platelets: 214 10*3/uL (ref 150.0–400.0)
RBC: 4.9 Mil/uL (ref 4.22–5.81)
RDW: 13.3 % (ref 11.5–15.5)
WBC: 6.9 10*3/uL (ref 4.0–10.5)

## 2016-05-19 LAB — PSA: PSA: 0.68 ng/mL (ref 0.10–4.00)

## 2016-05-19 LAB — LIPID PANEL
CHOL/HDL RATIO: 7
CHOLESTEROL: 343 mg/dL — AB (ref 0–200)
HDL: 47.7 mg/dL (ref >39.00)

## 2016-05-19 LAB — LDL CHOLESTEROL, DIRECT: Direct LDL: 166 mg/dL

## 2016-05-19 LAB — TSH: TSH: 2.14 u[IU]/mL (ref 0.35–4.50)

## 2016-05-24 ENCOUNTER — Ambulatory Visit (INDEPENDENT_AMBULATORY_CARE_PROVIDER_SITE_OTHER): Payer: BLUE CROSS/BLUE SHIELD | Admitting: Family Medicine

## 2016-05-24 ENCOUNTER — Encounter: Payer: Self-pay | Admitting: Family Medicine

## 2016-05-24 VITALS — BP 120/86 | HR 69 | Temp 97.6°F | Ht 68.75 in | Wt 252.1 lb

## 2016-05-24 DIAGNOSIS — Z0000 Encounter for general adult medical examination without abnormal findings: Secondary | ICD-10-CM

## 2016-05-24 DIAGNOSIS — Z8639 Personal history of other endocrine, nutritional and metabolic disease: Secondary | ICD-10-CM

## 2016-05-24 DIAGNOSIS — E785 Hyperlipidemia, unspecified: Secondary | ICD-10-CM

## 2016-05-24 NOTE — Progress Notes (Signed)
Subjective:     Patient ID: Gregg Hamilton, male   DOB: [birth year], 55 y.o.   MRN: [id number]  HPI Patient seen for physical. He has hypertension which has been controlled with lisinopril HCTZ. His job require some activity but he has not been exercising any regularly. Poor compliance with diet recently. But his mother and father had diabetes type 2. His mother had multiple complications from poorly controlled diabetes. Patient monitors his blood sugar periodically and these are usually around 100 fasting. Past history of B12 deficiency but he is currently not been on replacement for several months. He is not taking supplement currently. Nonsmoker. He recently completed DNA-based stool testing for polyps or cancer Tetanus up-to-date  Past Medical History:  Diagnosis Date  . Allergy   . Depression   . Heart murmur   . Hypertension   . Metabolic syndrome    Past Surgical History:  Procedure Laterality Date  . VASECTOMY      reports that he has never smoked. He has never used smokeless tobacco. He reports that he drinks alcohol. He reports that he does not use drugs. family history includes Cancer in his other; Diabetes in his mother; Heart disease in his mother; Hypertension in his other. No Active Allergies   Review of Systems  Constitutional: Negative for activity change, appetite change, fatigue, fever and unexpected weight change.  HENT: Negative for congestion, ear pain and trouble swallowing.   Eyes: Negative for pain and visual disturbance.  Respiratory: Negative for cough, chest tightness, shortness of breath and wheezing.   Cardiovascular: Negative for chest pain, palpitations and leg swelling.  Gastrointestinal: Negative for abdominal distention, abdominal pain, blood in stool, constipation, diarrhea, nausea, rectal pain and vomiting.  Endocrine: Negative for polydipsia and polyuria.  Genitourinary: Negative for dysuria, hematuria and testicular pain.  Musculoskeletal:  Negative for arthralgias and joint swelling.  Skin: Negative for rash.  Neurological: Negative for dizziness, syncope, weakness, light-headedness and headaches.  Hematological: Negative for adenopathy.  Psychiatric/Behavioral: Negative for confusion and dysphoric mood.       Objective:   Physical Exam  Constitutional: He is oriented to person, place, and time. He appears well-developed and well-nourished. No distress.  HENT:  Head: Normocephalic and atraumatic.  Right Ear: External ear normal.  Left Ear: External ear normal.  Mouth/Throat: Oropharynx is clear and moist.  Eyes: Conjunctivae and EOM are normal. Pupils are equal, round, and reactive to light.  Neck: Normal range of motion. Neck supple. No thyromegaly present.  Cardiovascular: Normal rate, regular rhythm and normal heart sounds.   No murmur heard. Pulmonary/Chest: No respiratory distress. He has no wheezes. He has no rales.  Abdominal: Soft. Bowel sounds are normal. He exhibits no distension and no mass. There is no tenderness. There is no rebound and no guarding.  Musculoskeletal: He exhibits no edema.  Lymphadenopathy:    He has no cervical adenopathy.  Neurological: He is alert and oriented to person, place, and time. He displays normal reflexes. No cranial nerve deficit.  Skin: No rash noted.  Psychiatric: He has a normal mood and affect.       Assessment:     Physical exam. Patient has obesity and strong family history type 2 diabetes. 10 year calculated risk of CAD event 11%. He has significant dyslipidemia. Labs were reviewed    Plan:     -we discussed consideration for statin therapy and he declines. He wishes to try to tighten up his diet and lose some weight over the  next 3 months and then repeat fasting lipids -Repeat B12 level follow-up in 3 months -Recent Cologuard completed and pending -Consider oral over-the-counter B12 replacement 1000 g daily  Kristian CoveyBruce W Nai Dasch MD [facility]  [facility]

## 2016-05-24 NOTE — Progress Notes (Signed)
Pre visit review using our clinic review tool, if applicable. No additional management support is needed unless otherwise documented below in the visit note. 

## 2016-05-24 NOTE — Patient Instructions (Signed)

## 2016-05-26 DIAGNOSIS — J301 Allergic rhinitis due to pollen: Secondary | ICD-10-CM | POA: Diagnosis not present

## 2016-05-26 DIAGNOSIS — H1045 Other chronic allergic conjunctivitis: Secondary | ICD-10-CM | POA: Diagnosis not present

## 2016-05-26 DIAGNOSIS — J3089 Other allergic rhinitis: Secondary | ICD-10-CM | POA: Diagnosis not present

## 2016-05-26 DIAGNOSIS — R21 Rash and other nonspecific skin eruption: Secondary | ICD-10-CM | POA: Diagnosis not present

## 2016-06-03 ENCOUNTER — Encounter: Payer: Self-pay | Admitting: Family Medicine

## 2016-07-09 DIAGNOSIS — M9901 Segmental and somatic dysfunction of cervical region: Secondary | ICD-10-CM | POA: Diagnosis not present

## 2016-07-09 DIAGNOSIS — M9902 Segmental and somatic dysfunction of thoracic region: Secondary | ICD-10-CM | POA: Diagnosis not present

## 2016-07-09 DIAGNOSIS — M791 Myalgia: Secondary | ICD-10-CM | POA: Diagnosis not present

## 2016-07-16 DIAGNOSIS — M9902 Segmental and somatic dysfunction of thoracic region: Secondary | ICD-10-CM | POA: Diagnosis not present

## 2016-07-16 DIAGNOSIS — M791 Myalgia: Secondary | ICD-10-CM | POA: Diagnosis not present

## 2016-07-16 DIAGNOSIS — M9901 Segmental and somatic dysfunction of cervical region: Secondary | ICD-10-CM | POA: Diagnosis not present

## 2016-07-21 DIAGNOSIS — M9901 Segmental and somatic dysfunction of cervical region: Secondary | ICD-10-CM | POA: Diagnosis not present

## 2016-07-21 DIAGNOSIS — M9902 Segmental and somatic dysfunction of thoracic region: Secondary | ICD-10-CM | POA: Diagnosis not present

## 2016-07-21 DIAGNOSIS — M791 Myalgia: Secondary | ICD-10-CM | POA: Diagnosis not present

## 2016-08-05 DIAGNOSIS — M9901 Segmental and somatic dysfunction of cervical region: Secondary | ICD-10-CM | POA: Diagnosis not present

## 2016-08-05 DIAGNOSIS — M791 Myalgia: Secondary | ICD-10-CM | POA: Diagnosis not present

## 2016-08-05 DIAGNOSIS — M9902 Segmental and somatic dysfunction of thoracic region: Secondary | ICD-10-CM | POA: Diagnosis not present

## 2016-08-11 DIAGNOSIS — M9901 Segmental and somatic dysfunction of cervical region: Secondary | ICD-10-CM | POA: Diagnosis not present

## 2016-08-11 DIAGNOSIS — M791 Myalgia: Secondary | ICD-10-CM | POA: Diagnosis not present

## 2016-08-11 DIAGNOSIS — M9902 Segmental and somatic dysfunction of thoracic region: Secondary | ICD-10-CM | POA: Diagnosis not present

## 2017-04-06 DIAGNOSIS — J301 Allergic rhinitis due to pollen: Secondary | ICD-10-CM | POA: Diagnosis not present

## 2017-04-06 DIAGNOSIS — H1045 Other chronic allergic conjunctivitis: Secondary | ICD-10-CM | POA: Diagnosis not present

## 2017-04-06 DIAGNOSIS — J3089 Other allergic rhinitis: Secondary | ICD-10-CM | POA: Diagnosis not present

## 2017-04-06 DIAGNOSIS — R21 Rash and other nonspecific skin eruption: Secondary | ICD-10-CM | POA: Diagnosis not present

## 2017-05-02 ENCOUNTER — Other Ambulatory Visit: Payer: Self-pay | Admitting: Family Medicine

## 2017-06-16 ENCOUNTER — Encounter: Payer: Self-pay | Admitting: Family Medicine

## 2017-08-05 ENCOUNTER — Other Ambulatory Visit: Payer: Self-pay | Admitting: Family Medicine

## 2017-08-05 DIAGNOSIS — M47812 Spondylosis without myelopathy or radiculopathy, cervical region: Secondary | ICD-10-CM | POA: Diagnosis not present

## 2017-08-05 NOTE — Telephone Encounter (Signed)
30 day supply with no refills PT IS DUE FOR AN OV

## 2017-08-25 DIAGNOSIS — M542 Cervicalgia: Secondary | ICD-10-CM | POA: Diagnosis not present

## 2017-09-01 DIAGNOSIS — M542 Cervicalgia: Secondary | ICD-10-CM | POA: Diagnosis not present

## 2017-09-03 ENCOUNTER — Other Ambulatory Visit: Payer: Self-pay | Admitting: Family Medicine

## 2017-09-08 DIAGNOSIS — M542 Cervicalgia: Secondary | ICD-10-CM | POA: Diagnosis not present

## 2017-09-13 ENCOUNTER — Other Ambulatory Visit: Payer: Self-pay | Admitting: Family Medicine

## 2017-09-15 ENCOUNTER — Telehealth: Payer: Self-pay | Admitting: Family Medicine

## 2017-09-15 DIAGNOSIS — M542 Cervicalgia: Secondary | ICD-10-CM | POA: Diagnosis not present

## 2017-09-15 NOTE — Telephone Encounter (Signed)
Pt came in the office wanting to see if we had rec'd paperwork for a referral to have a MRI done and he has made an appointment to see Dr. Caryl NeverBurchette so that he can have this done before the end of the year.  If he does not need the appointment he would like to cancel it before 09/19/17.  Fleet ContrasRachel I put the paperwork in the folder with a note on it.

## 2017-09-16 NOTE — Telephone Encounter (Signed)
We should keep appt.  Have not seen him in > one year.  My experience is that these tests frequently get denied by insurance if we have not evaluated and documented need for test.

## 2017-09-16 NOTE — Telephone Encounter (Signed)
Patient has an appointment on 09/19/17

## 2017-09-19 ENCOUNTER — Encounter: Payer: Self-pay | Admitting: Family Medicine

## 2017-09-19 ENCOUNTER — Ambulatory Visit: Payer: BLUE CROSS/BLUE SHIELD | Admitting: Family Medicine

## 2017-09-19 VITALS — BP 116/80 | HR 65 | Temp 97.6°F | Ht 68.75 in | Wt 244.9 lb

## 2017-09-19 DIAGNOSIS — M5412 Radiculopathy, cervical region: Secondary | ICD-10-CM | POA: Diagnosis not present

## 2017-09-19 NOTE — Progress Notes (Signed)
Subjective:     Patient ID: Gregg Hamilton, male   DOB: [birth year], 56 y.o.   MRN: [id number]  HPI Patient seen to evaluate new problem. He states he had similar problem 10-12 years ago of intermittent left-sided neck pain. [place] and apparently had epidural. His recent symptoms started months ago. Denies recent injury. He is right-hand dominant. He initially went to chiropractor and had x-rays but is not sure or results. Several treatments without much improvement.   He has now seen physical therapy for several weeks and has seen some improvement but still has occasional pain which radiates from the base of the neck down the left upper extremity. He is mostly experiencing some numbness and throbbing pain in the hand which seems to be worse in the morning in terms of weakness. He has decreased grip in the morning and sometimes increased pain throughout the day. Sometimes interfering with sleep.  We got a note from his physical therapist recommending he consider MRI. He has taken over-the-counter ibuprofen also without much improvement.  Past Medical History:  Diagnosis Date  . Allergy   . Depression   . Heart murmur   . Hypertension   . Metabolic syndrome    Past Surgical History:  Procedure Laterality Date  . VASECTOMY      reports that  has never smoked. he has never used smokeless tobacco. He reports that he drinks alcohol. He reports that he does not use drugs. family history includes Cancer in his other; Diabetes in his mother; Heart disease in his mother; Hypertension in his other. No Known Allergies   Review of Systems  Constitutional: Negative for chills and fever.  Respiratory: Negative for shortness of breath.   Cardiovascular: Negative for chest pain.  Musculoskeletal: Positive for neck pain.  Neurological: Positive for weakness and numbness.  Hematological: Negative for adenopathy.       Objective:   Physical Exam   Constitutional: He appears well-developed and well-nourished.  Neck: Neck supple.  He has pain with neck extension and also with lateral bending of the neck to the right side  Cardiovascular: Normal rate and regular rhythm.  Musculoskeletal: He exhibits no edema.  Neurological:  Only trace reflux upper extremity bilaterally. Has weakness with opposition of thumb and fifth digit on the left hand compared to the right/       Assessment:     Persistent left cervical radiculopathy with patient experiencing some paresthesias and weakness left upper extremity. Several weeks of chiropractic and physical therapy with some improvement but not resolution.    Plan:     -Set up MRI cervical spine to further assess -Followed immediately for any progressive weakness for worsening pain  Kristian CoveyBruce W Munira Polson MD [facility]

## 2017-09-19 NOTE — Patient Instructions (Signed)
Cervical Radiculopathy Cervical radiculopathy happens when a nerve in the neck (cervical nerve) is pinched or bruised. This condition can develop because of an injury or as part of the normal aging process. Pressure on the cervical nerves can cause pain or numbness that runs from the neck all the way down into the arm and fingers. Usually, this condition gets better with rest. Treatment may be needed if the condition does not improve. What are the causes? This condition may be caused by:  Injury.  Slipped (herniated) disk.  Muscle tightness in the neck because of overuse.  Arthritis.  Breakdown or degeneration in the bones and joints of the spine (spondylosis) due to aging.  Bone spurs that may develop near the cervical nerves.  What are the signs or symptoms? Symptoms of this condition include:  Pain that runs from the neck to the arm and hand. The pain can be severe or irritating. It may be worse when the neck is moved.  Numbness or weakness in the affected arm and hand.  How is this diagnosed? This condition may be diagnosed based on symptoms, medical history, and a physical exam. You may also have tests, including:  X-rays.  CT scan.  MRI.  Electromyogram (EMG).  Nerve conduction tests.  How is this treated? In many cases, treatment is not needed for this condition. With rest, the condition usually gets better over time. If treatment is needed, options may include:  Wearing a soft neck collar for short periods of time.  Physical therapy to strengthen your neck muscles.  Medicines, such as NSAIDs, oral corticosteroids, or spinal injections.  Surgery. This may be needed if other treatments do not help. Various types of surgery may be done depending on the cause of your problems.  Follow these instructions at home: Managing pain  Take over-the-counter and prescription medicines only as told by your health care provider.  If directed, apply ice to the affected  area. ? Put ice in a plastic bag. ? Place a towel between your skin and the bag. ? Leave the ice on for 20 minutes, 2-3 times per day.  If ice does not help, you can try using heat. Take a warm shower or warm bath, or use a heat pack as told by your health care provider.  Try a gentle neck and shoulder massage to help relieve symptoms. Activity  Rest as needed. Follow instructions from your health care provider about any restrictions on activities.  Do stretching and strengthening exercises as told by your health care provider or physical therapist. General instructions  If you were given a soft collar, wear it as told by your health care provider.  Use a flat pillow when you sleep.  Keep all follow-up visits as told by your health care provider. This is important. Contact a health care provider if:  Your condition does not improve with treatment. Get help right away if:  Your pain gets much worse and cannot be controlled with medicines.  You have weakness or numbness in your hand, arm, face, or leg.  You have a high fever.  You have a stiff, rigid neck.  You lose control of your bowels or your bladder (have incontinence).  You have trouble with walking, balance, or speaking. This information is not intended to replace advice given to you by your health care provider. Make sure you discuss any questions you have with your health care provider. Document Released: 06/08/2001 Document Revised: 02/19/2016 Document Reviewed: 11/07/2014 Elsevier Interactive Patient Education    2018 [employer]vier Inc[employer]  

## 2017-09-22 DIAGNOSIS — M542 Cervicalgia: Secondary | ICD-10-CM | POA: Diagnosis not present

## 2017-09-24 ENCOUNTER — Other Ambulatory Visit: Payer: BLUE CROSS/BLUE SHIELD

## 2017-09-29 DIAGNOSIS — M542 Cervicalgia: Secondary | ICD-10-CM | POA: Diagnosis not present

## 2017-10-06 DIAGNOSIS — M542 Cervicalgia: Secondary | ICD-10-CM | POA: Diagnosis not present

## 2017-10-14 ENCOUNTER — Ambulatory Visit
Admission: RE | Admit: 2017-10-14 | Discharge: 2017-10-14 | Disposition: A | Discharge status: Home / Self Care | Payer: BLUE CROSS/BLUE SHIELD | Source: Ambulatory Visit | Attending: Family Medicine | Admitting: Family Medicine

## 2017-10-14 DIAGNOSIS — M5412 Radiculopathy, cervical region: Secondary | ICD-10-CM

## 2017-10-14 DIAGNOSIS — M4802 Spinal stenosis, cervical region: Secondary | ICD-10-CM | POA: Diagnosis not present

## 2017-10-17 ENCOUNTER — Other Ambulatory Visit: Payer: Self-pay | Admitting: Family Medicine

## 2017-10-17 DIAGNOSIS — M542 Cervicalgia: Secondary | ICD-10-CM

## 2017-10-19 ENCOUNTER — Ambulatory Visit: Payer: BLUE CROSS/BLUE SHIELD | Admitting: Family Medicine

## 2017-10-19 ENCOUNTER — Encounter: Payer: Self-pay | Admitting: Family Medicine

## 2017-10-19 VITALS — BP 98/60 | HR 104 | Temp 99.9°F | Wt 256.2 lb

## 2017-10-19 DIAGNOSIS — J018 Other acute sinusitis: Secondary | ICD-10-CM

## 2017-10-19 DIAGNOSIS — R6889 Other general symptoms and signs: Secondary | ICD-10-CM

## 2017-10-19 LAB — POC INFLUENZA A&B (BINAX/QUICKVUE): INFLUENZA B, POC: NEGATIVE

## 2017-10-19 MED ORDER — HYDROCODONE-HOMATROPINE 5-1.5 MG/5ML PO SYRP: 5.0000 mL | ORAL_SOLUTION | ORAL | 0 refills | Status: DC | PRN

## 2017-10-19 MED ORDER — AMOXICILLIN-POT CLAVULANATE 875-125 MG PO TABS: 1.0000 | ORAL_TABLET | Freq: Two times a day (BID) | ORAL | 0 refills | Status: DC

## 2017-10-19 NOTE — Progress Notes (Signed)
   Subjective:    Patient ID: Gregg Hamilton, male    DOB: [birth year], 57 y.o.   MRN: [id number]  HPI Here for 2 days of fever, sinus pressure, PND, headache, ST, coughing up yellow sputum, and diarrhea. No N or V. Taking Tylenol and Mucinex.    Review of Systems  Constitutional: Positive for fever.  HENT: Positive for congestion, postnasal drip, sinus pressure, sinus pain and sore throat.   Eyes: Negative.   Respiratory: Positive for cough.   Gastrointestinal: Positive for diarrhea. Negative for abdominal pain, nausea and vomiting.       Objective:   Physical Exam  Constitutional: He appears well-developed and well-nourished. No distress.  HENT:  Right Ear: External ear normal.  Left Ear: External ear normal.  Nose: Nose normal.  Mouth/Throat: Oropharynx is clear and moist.  Eyes: Conjunctivae are normal.  Neck: No thyromegaly present.  Pulmonary/Chest: Effort normal and breath sounds normal. No respiratory distress. He has no wheezes. He has no rales.  Lymphadenopathy:    He has no cervical adenopathy.          Assessment & Plan:  Sinusitis, treat with Augmentin. Written out of work today and tomorrow.  Gershon CraneStephen Xsavier Seeley, MD

## 2017-10-27 DIAGNOSIS — Z6837 Body mass index (BMI) 37.0-37.9, adult: Secondary | ICD-10-CM | POA: Diagnosis not present

## 2017-10-27 DIAGNOSIS — M4802 Spinal stenosis, cervical region: Secondary | ICD-10-CM | POA: Diagnosis not present

## 2017-10-27 DIAGNOSIS — I10 Essential (primary) hypertension: Secondary | ICD-10-CM | POA: Diagnosis not present

## 2017-11-07 DIAGNOSIS — M4802 Spinal stenosis, cervical region: Secondary | ICD-10-CM | POA: Diagnosis not present

## 2017-11-07 DIAGNOSIS — G5603 Carpal tunnel syndrome, bilateral upper limbs: Secondary | ICD-10-CM | POA: Diagnosis not present

## 2017-11-09 DIAGNOSIS — G5603 Carpal tunnel syndrome, bilateral upper limbs: Secondary | ICD-10-CM | POA: Diagnosis not present

## 2018-01-25 HISTORY — PX: CARPAL TUNNEL RELEASE: SHX101

## 2018-02-11 DIAGNOSIS — R6883 Chills (without fever): Secondary | ICD-10-CM | POA: Diagnosis not present

## 2018-02-11 DIAGNOSIS — J018 Other acute sinusitis: Secondary | ICD-10-CM | POA: Diagnosis not present

## 2018-02-17 DIAGNOSIS — G5602 Carpal tunnel syndrome, left upper limb: Secondary | ICD-10-CM | POA: Diagnosis not present

## 2018-03-01 ENCOUNTER — Ambulatory Visit (INDEPENDENT_AMBULATORY_CARE_PROVIDER_SITE_OTHER): Payer: BLUE CROSS/BLUE SHIELD | Admitting: Family Medicine

## 2018-03-01 ENCOUNTER — Encounter: Payer: Self-pay | Admitting: Family Medicine

## 2018-03-01 VITALS — BP 130/80 | HR 68 | Temp 97.9°F | Ht 69.0 in | Wt 261.8 lb

## 2018-03-01 DIAGNOSIS — Z23 Encounter for immunization: Secondary | ICD-10-CM

## 2018-03-01 DIAGNOSIS — Z0000 Encounter for general adult medical examination without abnormal findings: Secondary | ICD-10-CM

## 2018-03-01 DIAGNOSIS — R0683 Snoring: Secondary | ICD-10-CM | POA: Diagnosis not present

## 2018-03-01 DIAGNOSIS — R400 Somnolence: Secondary | ICD-10-CM

## 2018-03-01 LAB — BASIC METABOLIC PANEL
BUN: 22 mg/dL (ref 6–23)
CO2: 31 meq/L (ref 19–32)
Calcium: 9.8 mg/dL (ref 8.4–10.5)
Chloride: 98 mEq/L (ref 96–112)
Creatinine, Ser: 0.96 mg/dL (ref 0.40–1.50)
GFR: 85.4 mL/min (ref >60.00)
Glucose, Bld: 97 mg/dL (ref 70–99)
Potassium: 4.4 mEq/L (ref 3.5–5.1)
SODIUM: 137 meq/L (ref 135–145)

## 2018-03-01 LAB — LIPID PANEL
CHOL/HDL RATIO: 8
Cholesterol: 347 mg/dL — ABNORMAL HIGH (ref 0–200)
HDL: 43.5 mg/dL (ref >39.00)

## 2018-03-01 LAB — CBC WITH DIFFERENTIAL/PLATELET
BASOS ABS: 0 10*3/uL (ref 0.0–0.1)
Basophils Relative: 0.6 % (ref 0.0–3.0)
EOS ABS: 0.2 10*3/uL (ref 0.0–0.7)
Eosinophils Relative: 2.4 % (ref 0.0–5.0)
HEMATOCRIT: 42.1 % (ref 39.0–52.0)
HEMOGLOBIN: 14.5 g/dL (ref 13.0–17.0)
LYMPHS PCT: 27.4 % (ref 12.0–46.0)
Lymphs Abs: 1.9 10*3/uL (ref 0.7–4.0)
MCHC: 34.3 g/dL (ref 30.0–36.0)
MCV: 86.7 fl (ref 78.0–100.0)
Monocytes Absolute: 0.5 10*3/uL (ref 0.1–1.0)
Monocytes Relative: 6.4 % (ref 3.0–12.0)
Neutro Abs: 4.5 10*3/uL (ref 1.4–7.7)
Neutrophils Relative %: 63.2 % (ref 43.0–77.0)
PLATELETS: 224 10*3/uL (ref 150.0–400.0)
RBC: 4.86 Mil/uL (ref 4.22–5.81)
RDW: 13.7 % (ref 11.5–15.5)
WBC: 7.1 10*3/uL (ref 4.0–10.5)

## 2018-03-01 LAB — HEPATIC FUNCTION PANEL
ALBUMIN: 4.4 g/dL (ref 3.5–5.2)
ALK PHOS: 72 U/L (ref 39–117)
ALT: 36 U/L (ref 0–53)
AST: 19 U/L (ref 0–37)
Bilirubin, Direct: 0.1 mg/dL (ref 0.0–0.3)
TOTAL PROTEIN: 7.2 g/dL (ref 6.0–8.3)
Total Bilirubin: 0.4 mg/dL (ref 0.2–1.2)

## 2018-03-01 LAB — PSA: PSA: 0.64 ng/mL (ref 0.10–4.00)

## 2018-03-01 LAB — TSH: TSH: 2.55 u[IU]/mL (ref 0.35–4.50)

## 2018-03-01 LAB — LDL CHOLESTEROL, DIRECT: Direct LDL: 173 mg/dL

## 2018-03-01 MED ORDER — LISINOPRIL-HYDROCHLOROTHIAZIDE 20-12.5 MG PO TABS: 1.0000 | ORAL_TABLET | Freq: Every day | ORAL | 3 refills | Status: DC

## 2018-03-01 NOTE — Patient Instructions (Signed)
We will set up referral for sleep apnea evaluation.   

## 2018-03-01 NOTE — Progress Notes (Addendum)
re Subjective:     Patient ID: Gregg Hamilton, male   DOB: 10/04/1960, 57 y.o.   MRN: [id number]  HPI Patient seen for physical exam. He has hypertension treated with lisinopril HCTZ. Colonoscopy up-to-date. He also did cologuard 2017 which was normal. Needs tetanus booster. No history of shingles vaccine and currently not interested in that. No history of hepatitis C screening. Low risk.  Concerns for possible obstructive sleep apnea. His wife has mentioned that he snores frequently. He also has a lot of fatigue each day when he wakes up. Would like to explore sleep study to rule out sleep apnea  Patient recent carpal tunnel surgery left wrist couple weeks ago and that went well  The ASCVD Risk score Denman George(Goff DC Jr., et al., 2013) failed to calculate for the following reasons:   The valid total cholesterol range is 130 to 320 mg/dL   Past Medical History:  Diagnosis Date  . Allergy   . Depression   . Heart murmur   . Hypertension   . Metabolic syndrome    Past Surgical History:  Procedure Laterality Date  . VASECTOMY      reports that he has never smoked. He has never used smokeless tobacco. He reports that he drinks alcohol. He reports that he does not use drugs. family history includes Cancer in his other; Diabetes in his mother; Heart disease in his mother; Hypertension in his other. No Known Allergies   Review of Systems  Constitutional: Positive for fatigue. Negative for activity change, appetite change and fever.  HENT: Negative for congestion, ear pain and trouble swallowing.   Eyes: Negative for pain and visual disturbance.  Respiratory: Negative for cough, shortness of breath and wheezing.   Cardiovascular: Negative for chest pain and palpitations.  Gastrointestinal: Negative for abdominal distention, abdominal pain, blood in stool, constipation, diarrhea, nausea, rectal pain and vomiting.  Genitourinary: Negative for dysuria, hematuria and testicular pain.   Musculoskeletal: Negative for arthralgias and joint swelling.  Skin: Negative for rash.  Neurological: Negative for dizziness, syncope and headaches.  Hematological: Negative for adenopathy.  Psychiatric/Behavioral: Negative for confusion and dysphoric mood.       Objective:   Physical Exam  Constitutional: He is oriented to person, place, and time. He appears well-developed and well-nourished. No distress.  HENT:  Head: Normocephalic and atraumatic.  Right Ear: External ear normal.  Left Ear: External ear normal.  Mouth/Throat: Oropharynx is clear and moist.  Eyes: Pupils are equal, round, and reactive to light. Conjunctivae and EOM are normal.  Neck: Normal range of motion. Neck supple. No thyromegaly present.  Cardiovascular: Normal rate, regular rhythm and normal heart sounds.  No murmur heard. Pulmonary/Chest: Effort normal and breath sounds normal. No respiratory distress. He has no wheezes. He has no rales.  Abdominal: Soft. Bowel sounds are normal. He exhibits no distension and no mass. There is no tenderness. There is no rebound and no guarding.  Musculoskeletal: He exhibits no edema.  Lymphadenopathy:    He has no cervical adenopathy.  Neurological: He is alert and oriented to person, place, and time. He displays normal reflexes. No cranial nerve deficit.  Skin: No rash noted.  Psychiatric: He has a normal mood and affect.       Assessment:     Physical exam. The following issues were discussed    Plan:     -Tetanus booster given -Check screening lab work including PSA. Also check hepatitis C antibody -Colonoscopy up-to-date but will need repeat in  a couple years -Set up referral to pulmonary to rule out sleep apnea  Kristian Covey MD [facility]

## 2018-03-02 LAB — HEPATITIS C ANTIBODY
HEP C AB: NONREACTIVE
SIGNAL TO CUT-OFF: 0.01 (ref <1.00)

## 2018-03-03 ENCOUNTER — Other Ambulatory Visit: Payer: Self-pay | Admitting: *Deleted

## 2018-03-03 DIAGNOSIS — E7849 Other hyperlipidemia: Secondary | ICD-10-CM

## 2018-03-24 ENCOUNTER — Other Ambulatory Visit: Payer: Self-pay | Admitting: Family Medicine

## 2018-03-27 DIAGNOSIS — M79605 Pain in left leg: Secondary | ICD-10-CM | POA: Diagnosis not present

## 2018-04-03 DIAGNOSIS — M79605 Pain in left leg: Secondary | ICD-10-CM | POA: Diagnosis not present

## 2018-04-07 ENCOUNTER — Encounter: Payer: Self-pay | Admitting: Family Medicine

## 2018-04-07 ENCOUNTER — Ambulatory Visit: Payer: BLUE CROSS/BLUE SHIELD | Admitting: Family Medicine

## 2018-04-07 VITALS — BP 126/84 | HR 102 | Temp 98.1°F | Ht 69.0 in | Wt 252.8 lb

## 2018-04-07 DIAGNOSIS — M5432 Sciatica, left side: Secondary | ICD-10-CM

## 2018-04-07 MED ORDER — METHYLPREDNISOLONE 4 MG PO TBPK: ORAL_TABLET | ORAL | 0 refills | Status: DC

## 2018-04-07 NOTE — Progress Notes (Signed)
   Subjective:    Patient ID: Gregg Hamilton, male    DOB: Jul 28, 1961, 57 y.o.   MRN: [id number]  HPI Here for 4 months of low back pain. This has become more frequent to where he has pain every day, and the pain has become more intense. This starts on the left side of the lower back and it radiates down the back of the left leg to the heel. No numbness or tingling or weakness. Aleve used to help but not now. He has tried a number of things on his own which have not helped at all, including PT, chiropractic manipulations, and dry needling. No hx of trauma.    Review of Systems  Constitutional: Negative.   Respiratory: Negative.   Cardiovascular: Negative.   Musculoskeletal: Positive for back pain.  Neurological: Negative.        Objective:   Physical Exam  Constitutional: He appears well-developed and well-nourished. No distress.  Cardiovascular: Normal rate, regular rhythm, normal heart sounds and intact distal pulses.  Pulmonary/Chest: Effort normal and breath sounds normal. No stridor. No respiratory distress. He has no wheezes. He has no rales.  Musculoskeletal:  He is mildly tender in the left lower back and over the left sciatic notch. No spasms. ROM is full. Negative SLR.           Assessment & Plan:  Left sided sciatica. Try a Medrol dose pack, heat, and rest. Recheck prn.  Gershon CraneStephen Fry, MD

## 2018-04-10 DIAGNOSIS — M79605 Pain in left leg: Secondary | ICD-10-CM | POA: Diagnosis not present

## 2018-04-18 ENCOUNTER — Encounter: Payer: Self-pay | Admitting: Pulmonary Disease

## 2018-04-18 ENCOUNTER — Ambulatory Visit (INDEPENDENT_AMBULATORY_CARE_PROVIDER_SITE_OTHER): Payer: BLUE CROSS/BLUE SHIELD | Admitting: Pulmonary Disease

## 2018-04-18 VITALS — BP 124/80 | HR 91 | Ht 69.0 in | Wt 256.0 lb

## 2018-04-18 DIAGNOSIS — G4733 Obstructive sleep apnea (adult) (pediatric): Secondary | ICD-10-CM | POA: Diagnosis not present

## 2018-04-18 NOTE — Patient Instructions (Signed)
Home sleep study 

## 2018-04-18 NOTE — Assessment & Plan Note (Signed)
Given excessive daytime somnolence, narrow pharyngeal exam, witnessed apneas & loud snoring, obstructive sleep apnea is very likely & an overnight polysomnogram will be scheduled as a home study. The pathophysiology of obstructive sleep apnea , it's cardiovascular consequences & modes of treatment including CPAP were discused with the patient in detail & they evidenced understanding.  Pretest probability is high.  He would be amenable to using a CPAP machine if required.  He would need a full facemask if needed since he is a mouth breather

## 2018-04-18 NOTE — Progress Notes (Signed)
Subjective:    Patient ID: Gregg Hamilton, male    DOB: 08-Nov-1960, 57 y.o.   MRN: [id number]  HPI  Chief Complaint  Patient presents with  . Sleep Consult    Referred by Dr. Caryl Never for possible OSA. First time OSA was mentioned during his surgery back in May 2019. Denies ever having a SS done before.     57 year old presents for evaluation of sleep disordered breathing. Loud snoring has been noted by his wife but she has never witnessed apneas.  He reports excessive daytime tiredness and nonrestorative sleep.  He recently had carpal tunnel surgery and on waking up was asked by anesthesiology if he had been evaluated for sleep apnea. 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 Epworth sleepiness score is 5 and he reports sleepiness or lying down to rest in the afternoon and is a passenger in a car. Bedtime is between 9 and 10:30 AM, sleep latency is minutes, he sleeps on his side with one pillow, reports 3-4 spontaneous nocturnal awakenings, denies nocturia and is out of bed by 6 AM feeling tired with dryness of mouth but denies headaches. He naps almost daily for about an hour in bed but naps are not refreshing. He has gained about 15 pounds in the last 2 years.  He has 2 cups of coffee in the morning and then does not use caffeinated beverages. He reports nasal congestion for many years for which he has tried many over-the-counter treatments and nasal prescription inhalers without significant relief  There is no history suggestive of cataplexy, sleep paralysis or parasomnias    Past Medical History:  Diagnosis Date  . Allergy   . Depression   . Heart murmur   . Hypertension   . Metabolic syndrome     Past Surgical History:  Procedure Laterality Date  . VASECTOMY      No Known Allergies   Social History   Socioeconomic History  . Marital status: Married    Spouse name: Not on file  . Number of children: Not on file  . Years of education:  Not on file  . Highest education level: Not on file  Occupational History  . Not on file  Social Needs  . Financial resource strain: Not on file  . Food insecurity:    Worry: Not on file    Inability: Not on file  . Transportation needs:    Medical: Not on file    Non-medical: Not on file  Tobacco Use  . Smoking status: Never Smoker  . Smokeless tobacco: Never Used  Substance and Sexual Activity  . Alcohol use: Yes  . Drug use: No  . Sexual activity: Not on file  Lifestyle  . Physical activity:    Days per week: Not on file    Minutes per session: Not on file  . Stress: Not on file  Relationships  . Social connections:    Talks on phone: Not on file    Gets together: Not on file    Attends religious service: Not on file    Active member of club or organization: Not on file    Attends meetings of clubs or organizations: Not on file    Relationship status: Not on file  . Intimate partner violence:    Fear of current or ex partner: Not on file    Emotionally abused: Not on file    Physically abused: Not on file    Forced  sexual activity: Not on file  Other Topics Concern  . 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 Diabetes Mother   . Heart disease Mother   . Hypertension Other   . Cancer Other        colon     Review of Systems   Positive for nasal congestion  Constitutional: negative for anorexia, fevers and sweats  Eyes: negative for irritation, redness and visual disturbance  Ears, nose, mouth, throat, and face: negative for earaches, epistaxis and sore throat  Respiratory: negative for cough, dyspnea on exertion, sputum and wheezing  Cardiovascular: negative for chest pain, dyspnea, lower extremity edema, orthopnea, palpitations and syncope  Gastrointestinal: negative for abdominal pain, constipation, diarrhea, melena, nausea and  vomiting  Genitourinary:negative for dysuria, frequency and hematuria  Hematologic/lymphatic: negative for bleeding, easy bruising and lymphadenopathy  Musculoskeletal:negative for arthralgias, muscle weakness and stiff joints  Neurological: negative for coordination problems, gait problems, headaches and weakness  Endocrine: negative for diabetic symptoms including polydipsia, polyuria and weight loss     Objective:   Physical Exam  Gen. Pleasant, obese, in no distress, normal affect ENT - mild underbite, long uvula,, no post nasal drip, class 2 airway Neck: No JVD, no thyromegaly, no carotid bruits Lungs: no use of accessory muscles, no dullness to percussion, decreased without rales or rhonchi  Cardiovascular: Rhythm regular, heart sounds  normal, no murmurs or gallops, no peripheral edema Abdomen: soft and non-tender, no hepatosplenomegaly, BS normal. Musculoskeletal: No deformities, no cyanosis or clubbing Neuro:  alert, non focal, no tremors        Assessment & Plan:

## 2018-04-24 DIAGNOSIS — M79605 Pain in left leg: Secondary | ICD-10-CM | POA: Diagnosis not present

## 2018-05-09 DIAGNOSIS — G4733 Obstructive sleep apnea (adult) (pediatric): Secondary | ICD-10-CM | POA: Diagnosis not present

## 2018-05-10 ENCOUNTER — Other Ambulatory Visit: Payer: Self-pay | Admitting: *Deleted

## 2018-05-10 DIAGNOSIS — G4733 Obstructive sleep apnea (adult) (pediatric): Secondary | ICD-10-CM | POA: Diagnosis not present

## 2018-05-16 ENCOUNTER — Telehealth: Payer: Self-pay | Admitting: Pulmonary Disease

## 2018-05-16 DIAGNOSIS — G4733 Obstructive sleep apnea (adult) (pediatric): Secondary | ICD-10-CM

## 2018-05-16 NOTE — Telephone Encounter (Signed)
Per RA, HST showed severe OSA with 34 events per hour, this increases when sleeping in the supine position. Recommends a CPAP titration study or auto cpap 5-20cm,full face mask. OV in 6wks.

## 2018-05-18 NOTE — Telephone Encounter (Signed)
Left message for patient to call back  

## 2018-06-02 NOTE — Telephone Encounter (Signed)
Spoke with Synetta Fail, she stated that the patient was calling back in regards to his sleep study results. Called patient again but did not reach him. Left him a message to call back for results.

## 2018-06-07 NOTE — Telephone Encounter (Signed)
Called and spoke with patient advised him of results. Patient would like to be set up on CPAP machine. Order placed. Nothing further needed.

## 2018-06-07 NOTE — Telephone Encounter (Signed)
Pt is calling back [phone]

## 2018-07-02 DIAGNOSIS — G4733 Obstructive sleep apnea (adult) (pediatric): Secondary | ICD-10-CM | POA: Diagnosis not present

## 2018-07-07 ENCOUNTER — Other Ambulatory Visit: Payer: Self-pay

## 2018-07-07 ENCOUNTER — Ambulatory Visit: Payer: BLUE CROSS/BLUE SHIELD | Admitting: Family Medicine

## 2018-07-07 ENCOUNTER — Ambulatory Visit: Payer: BLUE CROSS/BLUE SHIELD | Admitting: Pulmonary Disease

## 2018-07-07 ENCOUNTER — Encounter: Payer: Self-pay | Admitting: Family Medicine

## 2018-07-07 VITALS — BP 124/72 | HR 80 | Temp 98.1°F | Ht 69.0 in | Wt 256.2 lb

## 2018-07-07 DIAGNOSIS — J019 Acute sinusitis, unspecified: Secondary | ICD-10-CM | POA: Diagnosis not present

## 2018-07-07 MED ORDER — AMOXICILLIN-POT CLAVULANATE 875-125 MG PO TABS: 1.0000 | ORAL_TABLET | Freq: Two times a day (BID) | ORAL | 0 refills | Status: DC

## 2018-07-07 NOTE — Patient Instructions (Signed)

## 2018-07-07 NOTE — Progress Notes (Signed)
  Subjective:     Patient ID: Gregg Hamilton, male   DOB: [birth year], 57 y.o.   MRN: [id number]  HPI Patient seen with almost 2-week history of sinusitis type symptoms.  Initially saw about 2 weeks ago this with allergies but has had progressive symptoms of ethmoid and maxillary facial pain along with some daily headaches past few days.  Has had some greenish nasal discharge.  No fever.  Occasional cough  Past Medical History:  Diagnosis Date  . Allergy   . Depression   . Heart murmur   . Hypertension   . Metabolic syndrome    Past Surgical History:  Procedure Laterality Date  . CARPAL TUNNEL RELEASE  01/2018   Left hand  . VASECTOMY      reports that he has never smoked. He has never used smokeless tobacco. He reports that he drinks alcohol. He reports that he does not use drugs. family history includes Cancer in his other; Diabetes in his mother; Heart disease in his mother; Hypertension in his other. No Known Allergies   Review of Systems  Constitutional: Negative for chills and fever.  HENT: Positive for congestion, sinus pressure and sinus pain.   Respiratory: Positive for cough.   Neurological: Positive for headaches.       Objective:   Physical Exam  Constitutional: He appears well-developed and well-nourished.  HENT:  Right Ear: External ear normal.  Left Ear: External ear normal.  Mouth/Throat: Oropharynx is clear and moist.  Neck: Neck supple.  Cardiovascular: Normal rate and regular rhythm.  Pulmonary/Chest: Effort normal and breath sounds normal.  Lymphadenopathy:    He has no cervical adenopathy.       Assessment:     Probable acute sinusitis    Plan:     -Augmentin 875 mg twice daily for 10 days -Follow-up for any persistent or worsening symptoms  Kristian Covey MD [place] [facility] at [facility]

## 2018-08-02 DIAGNOSIS — G4733 Obstructive sleep apnea (adult) (pediatric): Secondary | ICD-10-CM | POA: Diagnosis not present

## 2018-08-11 ENCOUNTER — Ambulatory Visit: Payer: BLUE CROSS/BLUE SHIELD | Admitting: Pulmonary Disease

## 2018-08-11 ENCOUNTER — Encounter: Payer: Self-pay | Admitting: Pulmonary Disease

## 2018-08-11 DIAGNOSIS — G4733 Obstructive sleep apnea (adult) (pediatric): Secondary | ICD-10-CM | POA: Diagnosis not present

## 2018-08-11 DIAGNOSIS — E8881 Metabolic syndrome: Secondary | ICD-10-CM

## 2018-08-11 NOTE — Patient Instructions (Signed)
Change auto CPAP settings to 10 to 18 cm.  CPAP is working well.

## 2018-08-11 NOTE — Assessment & Plan Note (Signed)
Weight loss encouraged goal should be at least 25 pounds over the next few

## 2018-08-11 NOTE — Progress Notes (Signed)
   Subjective:    Patient ID: Gregg Hamilton, male    DOB: 1960-10-30, 57 y.o.   MRN: [id number]  HPI  57 year old for follow-up of severe OSA  We discussed home study results in detail.  He had severe OSA especially when he was in the supine position, better on his left side. We set him up with an auto CPAP settings and he is settling down well.  He feels better rested and has more energy.  Wife feels that he is doing fantastic. He has settled down with full facemask, complains of dryness of mouth when he wakes up in the morning.  Has played around with humidity settings. We reviewed download which shows average pressure of 15 cm with excellent control of events and great compliance, minimal leak  Significant tests/ events reviewed  04/2018 HST  AHI 34/h, severe when supine 64/hour  Review of Systems Patient denies significant dyspnea,cough, hemoptysis,  chest pain, palpitations, pedal edema, orthopnea, paroxysmal nocturnal dyspnea, lightheadedness, nausea, vomiting, abdominal or  leg pains      Objective:   Physical Exam  Gen. Pleasant, obese, in no distress ENT - no lesions, no post nasal drip Neck: No JVD, no thyromegaly, no carotid bruits Lungs: no use of accessory muscles, no dullness to percussion, decreased without rales or rhonchi  Cardiovascular: Rhythm regular, heart sounds  normal, no murmurs or gallops, no peripheral edema Musculoskeletal: No deformities, no cyanosis or clubbing , no tremors       Assessment & Plan:

## 2018-08-11 NOTE — Assessment & Plan Note (Signed)
CPAP is working extremely well and he has had good results with improvement in his daytime somnolence and fatigue. We will tighten his auto CPAP settings to 10 to 18 cm We discussed changes in humidity to help him with mild dryness. His compliance is good  Weight loss encouraged, compliance with goal of at least 4-6 hrs every night is the expectation. Advised against medications with sedative side effects Cautioned against driving when sleepy - understanding that sleepiness will vary on a day to day basis

## 2018-08-28 DIAGNOSIS — G4733 Obstructive sleep apnea (adult) (pediatric): Secondary | ICD-10-CM | POA: Diagnosis not present

## 2018-09-01 DIAGNOSIS — G4733 Obstructive sleep apnea (adult) (pediatric): Secondary | ICD-10-CM | POA: Diagnosis not present

## 2018-10-04 DIAGNOSIS — G4733 Obstructive sleep apnea (adult) (pediatric): Secondary | ICD-10-CM | POA: Diagnosis not present

## 2018-11-02 DIAGNOSIS — G4733 Obstructive sleep apnea (adult) (pediatric): Secondary | ICD-10-CM | POA: Diagnosis not present

## 2018-12-01 DIAGNOSIS — G4733 Obstructive sleep apnea (adult) (pediatric): Secondary | ICD-10-CM | POA: Diagnosis not present

## 2018-12-13 DIAGNOSIS — G4733 Obstructive sleep apnea (adult) (pediatric): Secondary | ICD-10-CM | POA: Diagnosis not present

## 2019-01-01 DIAGNOSIS — G4733 Obstructive sleep apnea (adult) (pediatric): Secondary | ICD-10-CM | POA: Diagnosis not present

## 2019-02-02 DIAGNOSIS — G4733 Obstructive sleep apnea (adult) (pediatric): Secondary | ICD-10-CM | POA: Diagnosis not present

## 2019-02-05 ENCOUNTER — Ambulatory Visit (INDEPENDENT_AMBULATORY_CARE_PROVIDER_SITE_OTHER): Payer: Self-pay | Admitting: Family Medicine

## 2019-02-05 ENCOUNTER — Other Ambulatory Visit: Payer: Self-pay

## 2019-02-05 DIAGNOSIS — M5432 Sciatica, left side: Secondary | ICD-10-CM

## 2019-02-05 DIAGNOSIS — E785 Hyperlipidemia, unspecified: Secondary | ICD-10-CM

## 2019-02-05 DIAGNOSIS — E8881 Metabolic syndrome: Secondary | ICD-10-CM

## 2019-02-05 DIAGNOSIS — I10 Essential (primary) hypertension: Secondary | ICD-10-CM

## 2019-02-05 NOTE — Progress Notes (Addendum)
Patient ID: Carmelina PealJames M Cadden, male   DOB: [birth year], 59 y.o.   MRN: [id number]  This visit type was conducted due to national recommendations for restrictions regarding the COVID-19 pandemic in an effort to limit this patient's exposure and mitigate transmission in our community.   Virtual Visit via Video Note  I connected with Mariel AloeJames Hendrickson on 02/05/19 at  4:00 PM EDT by a video enabled telemedicine application and verified that I am speaking with the correct person using two identifiers.  Location patient: home Location provider:work or home office Persons participating in the virtual visit: patient, provider  I discussed the limitations of evaluation and management by telemedicine and the availability of in person appointments. The patient expressed understanding and agreed to proceed.   HPI: Patient called to discuss the following items  1 year history of progressive left lumbar radiculitis symptoms.  He has achy pain which radiates from his left lower back region down to about the mid to lower calf region.  His pain has progressed especially over the past month or so but is gone on for over a year.  He denies any associated numbness or weakness.  No urine or stool incontinence.  Pain sometimes worse supine.  He has tried multiple things including Aleve, stretches, dry needling, cryotherapy, and chiropractic care without any improvement.  Denies any prior history of problems with lumbar spine.  No associated fever, chills, appetite change, or weight loss  Patient has hypertension which is treated with lisinopril HCTZ.  He has history of severe hyperlipidemia with lipids last year cholesterol 347 with elevated triglycerides.  We had recommended Lipitor that point he declined.  He does have history of metabolic syndrome.  He is requesting referral nutritional therapy.  He would like to discuss some weight loss strategies.   ROS: See pertinent positives and negatives per HPI.  Past Medical  History:  Diagnosis Date  . Allergy   . Depression   . Heart murmur   . Hypertension   . Metabolic syndrome     Past Surgical History:  Procedure Laterality Date  . CARPAL TUNNEL RELEASE  01/2018   Left hand  . VASECTOMY      Family History  Problem Relation Age of Onset  . Diabetes Mother   . Heart disease Mother   . Hypertension Other   . Cancer Other        colon    SOCIAL HX: Non-smoker   Current Outpatient Medications:  .  levocetirizine (XYZAL) 5 MG tablet, Take 5 mg by mouth daily. , Disp: , Rfl:  .  lisinopril-hydrochlorothiazide (PRINZIDE,ZESTORETIC) 20-12.5 MG tablet, Take 1 tablet by mouth daily., Disp: 90 tablet, Rfl: 3 .  naproxen sodium (ALEVE) 220 MG tablet, Take 220 mg by mouth., Disp: , Rfl:   EXAM:  VITALS per patient if applicable:  GENERAL: alert, oriented, appears well and in no acute distress  HEENT: atraumatic, conjunttiva clear, no obvious abnormalities on inspection of external nose and ears  NECK: normal movements of the head and neck  LUNGS: on inspection no signs of respiratory distress, breathing rate appears normal, no obvious gross SOB, gasping or wheezing  CV: no obvious cyanosis  MS: moves all visible extremities without noticeable abnormality  PSYCH/NEURO: pleasant and cooperative, no obvious depression or anxiety, speech and thought processing grossly intact  ASSESSMENT AND PLAN:  Discussed the following assessment and plan:  #1 left lumbar radiculitis symptoms.  Progressive for over a year and not responsive to multiple conservative therapies  including medications, stretches, chiropractic care, cryotherapy, dry needling -Set up MRI to further assess  #2 history of metabolic syndrome -Set up nutrition referral specifically to look at weight loss strategies and lipid management -We have suggested repeat fasting lipid panel in about 6 months and if lipids still up at that point after nutritional intervention consider  statin  #3 hypertension     I discussed the assessment and treatment plan with the patient. The patient was provided an opportunity to ask questions and all were answered. The patient agreed with the plan and demonstrated an understanding of the instructions.   The patient was advised to call back or seek an in-person evaluation if the symptoms worsen or if the condition fails to improve as anticipated.     Evelena Peat, MD   Addendum:  MRI results:  IMPRESSION: Moderately severe congenital lumbar stenosis. Multilevel degenerative change causing significant spinal stenosis throughout the lumbar spine. Severe spinal stenosis at L4-5 with moderate spinal stenosis at L2-3 and L3-4.  Setting up neurosurgical referral.  Kristian Covey MD [place]  [facility] at [facility]

## 2019-02-16 ENCOUNTER — Other Ambulatory Visit: Payer: Self-pay | Admitting: Family Medicine

## 2019-02-17 ENCOUNTER — Ambulatory Visit
Admission: RE | Admit: 2019-02-17 | Discharge: 2019-02-17 | Disposition: A | Discharge status: Home / Self Care | Payer: BLUE CROSS/BLUE SHIELD | Source: Ambulatory Visit | Attending: Family Medicine | Admitting: Family Medicine

## 2019-02-17 ENCOUNTER — Other Ambulatory Visit: Payer: Self-pay

## 2019-02-17 DIAGNOSIS — M5432 Sciatica, left side: Secondary | ICD-10-CM

## 2019-02-17 DIAGNOSIS — M48061 Spinal stenosis, lumbar region without neurogenic claudication: Secondary | ICD-10-CM | POA: Diagnosis not present

## 2019-03-05 DIAGNOSIS — G4733 Obstructive sleep apnea (adult) (pediatric): Secondary | ICD-10-CM | POA: Diagnosis not present

## 2019-03-06 NOTE — Addendum Note (Signed)
Addended by: Eulas Post on: 03/06/2019 05:24 PM   Modules accepted: Orders

## 2019-03-08 ENCOUNTER — Encounter: Payer: BC Managed Care – PPO | Attending: Family Medicine | Admitting: Registered"

## 2019-03-08 ENCOUNTER — Encounter: Payer: Self-pay | Admitting: Registered"

## 2019-03-08 ENCOUNTER — Other Ambulatory Visit: Payer: Self-pay

## 2019-03-08 DIAGNOSIS — E8881 Metabolic syndrome: Secondary | ICD-10-CM | POA: Diagnosis not present

## 2019-03-08 NOTE — Progress Notes (Signed)
Medical Nutrition Therapy:  Appt start time: 1645 end time:  1745.  Assessment:  Primary concerns today: Pt referred due to metabolic syndrome. Pt present for appointment alone. Pt reports that diabetes runs in his family and he wants to avoid developing it. Pt reports that his mother required multiple amputations d/t diabetes and seeing her struggle with the condition was hard on him. Reports his father also has diabetes but it is well managed. Pt reports he wants to lose weight. Reports he has had trouble with weight loss in the past. Reports he has tried several diets in the past and would initially lose weight but then would gain it back after returning to old eating habits. Reports they were not sustainable. Reports he needs structure. Pt also wants to know about insulin resistance. Pt has sciatica which makes getting in physical activity difficult. 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 Pt works for a [employer].   Food Allergies/Intolerances: None reported.   GI Concerns: None reported.   Sleep Pattern: 8-8.5 hours per night. Sleeping well now that pt has CPAP.   Pertinent Lab Values: 03/01/08 Pt has not had lab work since last year  Triglycerides: 452 Total Cholesterol: 347 HDL: 43.50  Preferred Learning Style:   No preference indicated   Learning Readiness:  Ready  MEDICATIONS: Reviewed.    DIETARY INTAKE:  Usual eating pattern includes 3 meals and 0 snacks per day.   Common foods: chicken.  Avoided foods: none reported.    Typical Snacks: none reported.     Typical Beverages: water; beer 2-3 days per week usually between 4-5 x 12 oz beers at a time.  Location of Meals: kitchen table; eats meals at home with wife.  Electronics Present at [employer]alt[employer]: Yes: phone   24-hr recall:  B ( AM): None reported.  Snk ( AM): None reported.  L ( PM): [employer] drive through-double hamburger, medium french fries, unsweet  tea Snk ( PM): None reported.  D ( PM): chicken, rice, peas, water  Snk ( PM): None reported.  Beverages: 64 oz water per day   Usual physical activity: physical work in job; none otherwise. Reports getting in activity is difficult d/t sciatica. Pt reports he is going to see a specialist soon.   Progress Towards Goal(s):  In progress.   Nutritional Diagnosis:  NB-1.1 Food and nutrition-related knowledge deficit As related to heart healthy nutrition .  As evidenced by no prior education from a dietitian reported.    Intervention:  Nutrition counseling provided. Dietitian provided education regarding heart healthy nutrition. Provided education regarding the relationship between dietary intake and blood sugar, insulin resistance. Discussed importance of following a balanced diet which can be sustained long-term and provided counseling on importance of focusing on healthy habits that promote overall health rather than focusing on weight. Worked with pt to set pt driven goals. Pt reports he would really like to work on increasing water and vegetable intake, and packing lunch each day instead of picking up food. Discussed plan for packing lunch.Discussed that alcohol raises triglycerides levels and that he could set a goal to reduce intake of beer. Pt did not want to set that goal at this time. Pt appeared agreeable to information/goals discussed.   Instructions/Goals:  Make sure to get in three meals per day. Try to have balanced meals like the My Plate example (see handout). Include lean proteins, vegetables, fruits, and whole grains  at meals.  -Include heart healthy unsaturated fats in place of saturated fats (see list). Recommend incorporating more food sources of omega 3 fatty acids (see handout).    Goal: Include at least 1 non-starchy vegetable at lunch and dinner   Goal: Increase water to 80 oz water per day    Goal: Pack lunch each day:   Pick a time to pack meals  Make a list of  ideal foods to pack: Easy to eat on the road: sandwiches, wraps, etc (think whole grains, lots of non-starchy vegetables, and a lean protein)  Make grocery list of items needed to pack for the week  Teaching Method Utilized:  Visual Auditory  Handouts given during visit include:  Balanced plate and food list.   Heart Healthy Nutrition   Barriers to learning/adherence to lifestyle change: None indicated.   Demonstrated degree of understanding via:  Teach Back   Monitoring/Evaluation:  Dietary intake, exercise, and body weight in 1 month(s).

## 2019-03-08 NOTE — Patient Instructions (Addendum)
Instructions/Goals:  Make sure to get in three meals per day. Try to have balanced meals like the My Plate example (see handout). Include lean proteins, vegetables, fruits, and whole grains at meals.  -Include heart healthy unsaturated fats in place of saturated fats (see list). Recommend incorporating more food sources of omega 3 fatty acids (see handout).    Goal: Include at least 1 non-starchy vegetable at lunch and dinner   Goal: Increase water to 80 oz water per day    Goal: Pack lunch each day:   Pick a time to pack meals  Make a list of ideal foods to pack: Easy to eat on the road: sandwiches, wraps, etc (think whole grains, lots of non-starchy vegetables, and a lean protein)  Make grocery list of items needed to pack for the week

## 2019-03-16 DIAGNOSIS — G4733 Obstructive sleep apnea (adult) (pediatric): Secondary | ICD-10-CM | POA: Diagnosis not present

## 2019-03-19 ENCOUNTER — Telehealth: Payer: Self-pay | Admitting: Family Medicine

## 2019-03-19 NOTE — Telephone Encounter (Signed)
Pt is aware that he has an appt with [facility]  03-21-2019@3 :00 Dr. Trenton Gammon he said that what he was calling about

## 2019-03-21 DIAGNOSIS — I10 Essential (primary) hypertension: Secondary | ICD-10-CM | POA: Diagnosis not present

## 2019-03-21 DIAGNOSIS — Z6838 Body mass index (BMI) 38.0-38.9, adult: Secondary | ICD-10-CM | POA: Diagnosis not present

## 2019-03-21 DIAGNOSIS — M5416 Radiculopathy, lumbar region: Secondary | ICD-10-CM | POA: Diagnosis not present

## 2019-03-21 DIAGNOSIS — M7138 Other bursal cyst, other site: Secondary | ICD-10-CM | POA: Diagnosis not present

## 2019-03-27 ENCOUNTER — Other Ambulatory Visit: Payer: Self-pay | Admitting: Family Medicine

## 2019-03-27 DIAGNOSIS — Z0000 Encounter for general adult medical examination without abnormal findings: Secondary | ICD-10-CM

## 2019-04-04 DIAGNOSIS — G4733 Obstructive sleep apnea (adult) (pediatric): Secondary | ICD-10-CM | POA: Diagnosis not present

## 2019-04-05 DIAGNOSIS — M5416 Radiculopathy, lumbar region: Secondary | ICD-10-CM | POA: Diagnosis not present

## 2019-04-05 DIAGNOSIS — M48062 Spinal stenosis, lumbar region with neurogenic claudication: Secondary | ICD-10-CM | POA: Diagnosis not present

## 2019-04-27 DIAGNOSIS — M5416 Radiculopathy, lumbar region: Secondary | ICD-10-CM | POA: Diagnosis not present

## 2019-04-27 DIAGNOSIS — M48062 Spinal stenosis, lumbar region with neurogenic claudication: Secondary | ICD-10-CM | POA: Diagnosis not present

## 2019-05-05 DIAGNOSIS — G4733 Obstructive sleep apnea (adult) (pediatric): Secondary | ICD-10-CM | POA: Diagnosis not present

## 2019-06-05 DIAGNOSIS — G4733 Obstructive sleep apnea (adult) (pediatric): Secondary | ICD-10-CM | POA: Diagnosis not present

## 2019-07-05 DIAGNOSIS — G4733 Obstructive sleep apnea (adult) (pediatric): Secondary | ICD-10-CM | POA: Diagnosis not present

## 2019-07-08 ENCOUNTER — Other Ambulatory Visit: Payer: Self-pay | Admitting: Family Medicine

## 2019-07-08 DIAGNOSIS — Z0000 Encounter for general adult medical examination without abnormal findings: Secondary | ICD-10-CM

## 2019-07-24 DIAGNOSIS — R5383 Other fatigue: Secondary | ICD-10-CM | POA: Diagnosis not present

## 2019-07-24 DIAGNOSIS — R7301 Impaired fasting glucose: Secondary | ICD-10-CM | POA: Diagnosis not present

## 2019-07-24 DIAGNOSIS — E559 Vitamin D deficiency, unspecified: Secondary | ICD-10-CM | POA: Diagnosis not present

## 2019-07-24 DIAGNOSIS — E782 Mixed hyperlipidemia: Secondary | ICD-10-CM | POA: Diagnosis not present

## 2019-07-24 DIAGNOSIS — R635 Abnormal weight gain: Secondary | ICD-10-CM | POA: Diagnosis not present

## 2019-07-24 DIAGNOSIS — Z6839 Body mass index (BMI) 39.0-39.9, adult: Secondary | ICD-10-CM | POA: Diagnosis not present

## 2019-07-26 DIAGNOSIS — Z1331 Encounter for screening for depression: Secondary | ICD-10-CM | POA: Diagnosis not present

## 2019-07-26 DIAGNOSIS — Z1339 Encounter for screening examination for other mental health and behavioral disorders: Secondary | ICD-10-CM | POA: Diagnosis not present

## 2019-07-26 DIAGNOSIS — E782 Mixed hyperlipidemia: Secondary | ICD-10-CM | POA: Diagnosis not present

## 2019-07-26 DIAGNOSIS — Z6839 Body mass index (BMI) 39.0-39.9, adult: Secondary | ICD-10-CM | POA: Diagnosis not present

## 2019-07-26 DIAGNOSIS — R5383 Other fatigue: Secondary | ICD-10-CM | POA: Diagnosis not present

## 2019-08-01 DIAGNOSIS — E782 Mixed hyperlipidemia: Secondary | ICD-10-CM | POA: Diagnosis not present

## 2019-08-01 DIAGNOSIS — Z6839 Body mass index (BMI) 39.0-39.9, adult: Secondary | ICD-10-CM | POA: Diagnosis not present

## 2019-08-05 DIAGNOSIS — G4733 Obstructive sleep apnea (adult) (pediatric): Secondary | ICD-10-CM | POA: Diagnosis not present

## 2019-08-09 DIAGNOSIS — E291 Testicular hypofunction: Secondary | ICD-10-CM | POA: Diagnosis not present

## 2019-08-16 ENCOUNTER — Other Ambulatory Visit: Payer: Self-pay

## 2019-08-16 DIAGNOSIS — Z6837 Body mass index (BMI) 37.0-37.9, adult: Secondary | ICD-10-CM | POA: Diagnosis not present

## 2019-08-16 DIAGNOSIS — Z20822 Contact with and (suspected) exposure to covid-19: Secondary | ICD-10-CM

## 2019-08-16 DIAGNOSIS — E291 Testicular hypofunction: Secondary | ICD-10-CM | POA: Diagnosis not present

## 2019-08-16 DIAGNOSIS — I10 Essential (primary) hypertension: Secondary | ICD-10-CM | POA: Diagnosis not present

## 2019-08-17 DIAGNOSIS — Z6837 Body mass index (BMI) 37.0-37.9, adult: Secondary | ICD-10-CM | POA: Diagnosis not present

## 2019-08-17 DIAGNOSIS — U071 COVID-19: Secondary | ICD-10-CM | POA: Diagnosis not present

## 2019-08-18 LAB — NOVEL CORONAVIRUS, NAA: SARS-CoV-2, NAA: DETECTED — AB

## 2019-08-31 DIAGNOSIS — E559 Vitamin D deficiency, unspecified: Secondary | ICD-10-CM | POA: Diagnosis not present

## 2019-08-31 DIAGNOSIS — E782 Mixed hyperlipidemia: Secondary | ICD-10-CM | POA: Diagnosis not present

## 2019-08-31 DIAGNOSIS — E291 Testicular hypofunction: Secondary | ICD-10-CM | POA: Diagnosis not present

## 2019-08-31 DIAGNOSIS — Z6839 Body mass index (BMI) 39.0-39.9, adult: Secondary | ICD-10-CM | POA: Diagnosis not present

## 2019-09-04 DIAGNOSIS — G4733 Obstructive sleep apnea (adult) (pediatric): Secondary | ICD-10-CM | POA: Diagnosis not present

## 2019-09-06 DIAGNOSIS — E291 Testicular hypofunction: Secondary | ICD-10-CM | POA: Diagnosis not present

## 2019-09-10 ENCOUNTER — Ambulatory Visit: Payer: BC Managed Care – PPO | Admitting: Family Medicine

## 2019-09-14 DIAGNOSIS — I10 Essential (primary) hypertension: Secondary | ICD-10-CM | POA: Diagnosis not present

## 2019-09-14 DIAGNOSIS — Z6838 Body mass index (BMI) 38.0-38.9, adult: Secondary | ICD-10-CM | POA: Diagnosis not present

## 2019-09-14 DIAGNOSIS — E291 Testicular hypofunction: Secondary | ICD-10-CM | POA: Diagnosis not present

## 2019-09-14 DIAGNOSIS — E8881 Metabolic syndrome: Secondary | ICD-10-CM | POA: Diagnosis not present

## 2019-09-14 DIAGNOSIS — Z6839 Body mass index (BMI) 39.0-39.9, adult: Secondary | ICD-10-CM | POA: Diagnosis not present

## 2019-09-14 DIAGNOSIS — R5383 Other fatigue: Secondary | ICD-10-CM | POA: Diagnosis not present

## 2019-09-19 DIAGNOSIS — E782 Mixed hyperlipidemia: Secondary | ICD-10-CM | POA: Diagnosis not present

## 2019-09-19 DIAGNOSIS — M2550 Pain in unspecified joint: Secondary | ICD-10-CM | POA: Diagnosis not present

## 2019-09-19 DIAGNOSIS — I10 Essential (primary) hypertension: Secondary | ICD-10-CM | POA: Diagnosis not present

## 2019-09-19 DIAGNOSIS — E291 Testicular hypofunction: Secondary | ICD-10-CM | POA: Diagnosis not present

## 2019-09-26 DIAGNOSIS — E291 Testicular hypofunction: Secondary | ICD-10-CM | POA: Diagnosis not present

## 2019-09-26 DIAGNOSIS — E8881 Metabolic syndrome: Secondary | ICD-10-CM | POA: Diagnosis not present

## 2019-09-26 DIAGNOSIS — E559 Vitamin D deficiency, unspecified: Secondary | ICD-10-CM | POA: Diagnosis not present

## 2019-09-26 DIAGNOSIS — Z6839 Body mass index (BMI) 39.0-39.9, adult: Secondary | ICD-10-CM | POA: Diagnosis not present

## 2019-10-03 DIAGNOSIS — Z6838 Body mass index (BMI) 38.0-38.9, adult: Secondary | ICD-10-CM | POA: Diagnosis not present

## 2019-10-03 DIAGNOSIS — E782 Mixed hyperlipidemia: Secondary | ICD-10-CM | POA: Diagnosis not present

## 2019-10-03 DIAGNOSIS — E291 Testicular hypofunction: Secondary | ICD-10-CM | POA: Diagnosis not present

## 2019-10-05 DIAGNOSIS — G4733 Obstructive sleep apnea (adult) (pediatric): Secondary | ICD-10-CM | POA: Diagnosis not present

## 2019-10-11 DIAGNOSIS — I10 Essential (primary) hypertension: Secondary | ICD-10-CM | POA: Diagnosis not present

## 2019-10-11 DIAGNOSIS — E291 Testicular hypofunction: Secondary | ICD-10-CM | POA: Diagnosis not present

## 2019-10-11 DIAGNOSIS — Z6838 Body mass index (BMI) 38.0-38.9, adult: Secondary | ICD-10-CM | POA: Diagnosis not present

## 2019-10-12 ENCOUNTER — Other Ambulatory Visit: Payer: Self-pay | Admitting: Family Medicine

## 2019-10-12 DIAGNOSIS — Z0000 Encounter for general adult medical examination without abnormal findings: Secondary | ICD-10-CM

## 2019-10-18 DIAGNOSIS — E291 Testicular hypofunction: Secondary | ICD-10-CM | POA: Diagnosis not present

## 2019-10-18 DIAGNOSIS — Z6838 Body mass index (BMI) 38.0-38.9, adult: Secondary | ICD-10-CM | POA: Diagnosis not present

## 2019-10-18 DIAGNOSIS — E559 Vitamin D deficiency, unspecified: Secondary | ICD-10-CM | POA: Diagnosis not present

## 2019-10-24 DIAGNOSIS — Z6838 Body mass index (BMI) 38.0-38.9, adult: Secondary | ICD-10-CM | POA: Diagnosis not present

## 2019-10-24 DIAGNOSIS — E782 Mixed hyperlipidemia: Secondary | ICD-10-CM | POA: Diagnosis not present

## 2019-10-24 DIAGNOSIS — E291 Testicular hypofunction: Secondary | ICD-10-CM | POA: Diagnosis not present

## 2019-10-24 DIAGNOSIS — I10 Essential (primary) hypertension: Secondary | ICD-10-CM | POA: Diagnosis not present

## 2019-11-01 DIAGNOSIS — Z6838 Body mass index (BMI) 38.0-38.9, adult: Secondary | ICD-10-CM | POA: Diagnosis not present

## 2019-11-01 DIAGNOSIS — I10 Essential (primary) hypertension: Secondary | ICD-10-CM | POA: Diagnosis not present

## 2019-11-01 DIAGNOSIS — E291 Testicular hypofunction: Secondary | ICD-10-CM | POA: Diagnosis not present

## 2019-11-05 DIAGNOSIS — G4733 Obstructive sleep apnea (adult) (pediatric): Secondary | ICD-10-CM | POA: Diagnosis not present

## 2019-11-08 DIAGNOSIS — E291 Testicular hypofunction: Secondary | ICD-10-CM | POA: Diagnosis not present

## 2019-11-08 DIAGNOSIS — Z6838 Body mass index (BMI) 38.0-38.9, adult: Secondary | ICD-10-CM | POA: Diagnosis not present

## 2019-11-08 DIAGNOSIS — E782 Mixed hyperlipidemia: Secondary | ICD-10-CM | POA: Diagnosis not present

## 2019-11-22 DIAGNOSIS — Z6838 Body mass index (BMI) 38.0-38.9, adult: Secondary | ICD-10-CM | POA: Diagnosis not present

## 2019-11-22 DIAGNOSIS — I10 Essential (primary) hypertension: Secondary | ICD-10-CM | POA: Diagnosis not present

## 2019-11-29 DIAGNOSIS — Z6838 Body mass index (BMI) 38.0-38.9, adult: Secondary | ICD-10-CM | POA: Diagnosis not present

## 2019-11-29 DIAGNOSIS — E8881 Metabolic syndrome: Secondary | ICD-10-CM | POA: Diagnosis not present

## 2019-12-13 DIAGNOSIS — E291 Testicular hypofunction: Secondary | ICD-10-CM | POA: Diagnosis not present

## 2019-12-13 DIAGNOSIS — Z6838 Body mass index (BMI) 38.0-38.9, adult: Secondary | ICD-10-CM | POA: Diagnosis not present

## 2019-12-13 DIAGNOSIS — R7301 Impaired fasting glucose: Secondary | ICD-10-CM | POA: Diagnosis not present

## 2020-01-11 ENCOUNTER — Other Ambulatory Visit: Payer: Self-pay | Admitting: Family Medicine

## 2020-01-11 DIAGNOSIS — Z0000 Encounter for general adult medical examination without abnormal findings: Secondary | ICD-10-CM

## 2020-02-11 ENCOUNTER — Other Ambulatory Visit: Payer: Self-pay | Admitting: Family Medicine

## 2020-02-11 DIAGNOSIS — Z0000 Encounter for general adult medical examination without abnormal findings: Secondary | ICD-10-CM

## 2020-02-11 NOTE — Telephone Encounter (Signed)
Lvm pt need s appt for further refills  

## 2020-02-21 DIAGNOSIS — Z20828 Contact with and (suspected) exposure to other viral communicable diseases: Secondary | ICD-10-CM | POA: Diagnosis not present

## 2020-02-21 DIAGNOSIS — Z20822 Contact with and (suspected) exposure to covid-19: Secondary | ICD-10-CM | POA: Diagnosis not present

## 2020-03-03 ENCOUNTER — Other Ambulatory Visit: Payer: Self-pay | Admitting: Family Medicine

## 2020-03-03 DIAGNOSIS — Z0000 Encounter for general adult medical examination without abnormal findings: Secondary | ICD-10-CM

## 2020-03-03 NOTE — Telephone Encounter (Signed)
Lvm for pt to call back pt needs a ov

## 2020-03-19 ENCOUNTER — Other Ambulatory Visit: Payer: Self-pay | Admitting: Family Medicine

## 2020-03-19 DIAGNOSIS — Z0000 Encounter for general adult medical examination without abnormal findings: Secondary | ICD-10-CM

## 2020-04-05 ENCOUNTER — Other Ambulatory Visit: Payer: Self-pay | Admitting: Family Medicine

## 2020-04-05 DIAGNOSIS — Z0000 Encounter for general adult medical examination without abnormal findings: Secondary | ICD-10-CM

## 2020-04-20 ENCOUNTER — Other Ambulatory Visit: Payer: Self-pay | Admitting: Family Medicine

## 2020-04-20 DIAGNOSIS — Z0000 Encounter for general adult medical examination without abnormal findings: Secondary | ICD-10-CM

## 2020-05-09 ENCOUNTER — Other Ambulatory Visit: Payer: Self-pay | Admitting: Family Medicine

## 2020-05-09 DIAGNOSIS — Z0000 Encounter for general adult medical examination without abnormal findings: Secondary | ICD-10-CM

## 2020-06-02 ENCOUNTER — Other Ambulatory Visit: Payer: Self-pay | Admitting: Family Medicine

## 2020-06-02 DIAGNOSIS — Z0000 Encounter for general adult medical examination without abnormal findings: Secondary | ICD-10-CM

## 2020-06-06 ENCOUNTER — Encounter: Payer: Self-pay | Admitting: Family Medicine

## 2020-06-06 ENCOUNTER — Ambulatory Visit: Payer: BC Managed Care – PPO | Admitting: Family Medicine

## 2020-06-06 ENCOUNTER — Other Ambulatory Visit: Payer: Self-pay

## 2020-06-06 VITALS — BP 150/102 | HR 96 | Temp 98.1°F | Ht 70.0 in | Wt 273.0 lb

## 2020-06-06 DIAGNOSIS — I10 Essential (primary) hypertension: Secondary | ICD-10-CM

## 2020-06-06 DIAGNOSIS — E785 Hyperlipidemia, unspecified: Secondary | ICD-10-CM | POA: Diagnosis not present

## 2020-06-06 DIAGNOSIS — Z1211 Encounter for screening for malignant neoplasm of colon: Secondary | ICD-10-CM

## 2020-06-06 DIAGNOSIS — E8881 Metabolic syndrome: Secondary | ICD-10-CM | POA: Diagnosis not present

## 2020-06-06 DIAGNOSIS — Z0000 Encounter for general adult medical examination without abnormal findings: Secondary | ICD-10-CM

## 2020-06-06 MED ORDER — LISINOPRIL-HYDROCHLOROTHIAZIDE 20-12.5 MG PO TABS: ORAL_TABLET | ORAL | 3 refills | Status: DC

## 2020-06-06 NOTE — Patient Instructions (Signed)
Monitor blood pressure and be in touch if consistently > 140/90  I will put in referral for colonoscopy  Set up/schedule labs for next week.

## 2020-06-06 NOTE — Progress Notes (Signed)
Established Patient Office Visit  Subjective:  Patient ID: Gregg Hamilton, male    DOB: 1961-06-23  Age: 59 y.o. MRN: [id number]  CC: No chief complaint on file.   HPI Gregg Hamilton presents for medical follow-up.  He has history of obesity, hypertension, hyperlipidemia, metabolic syndrome.  He had extremely high lipids last year with cholesterol 347 and high triglycerides and we had suggested Lipitor but he never went on this.  He still remains somewhat reluctant to consider statin.  He takes lisinopril HCTZ for hypertension.  He ran out of medication about 5 days ago.  Prior to running out his blood pressures were stable.  No recent headaches or dizziness.  He did have Covid last November but had extremely mild symptoms.  No residual symptoms.  He did Cologuard 2017 which was negative.  He would like to consider setting up colonoscopy at this time  Past Medical History:  Diagnosis Date   Allergy    Depression    Heart murmur    Hyperlipidemia    Hypertension    Metabolic syndrome    Sleep apnea     Past Surgical History:  Procedure Laterality Date   CARPAL TUNNEL RELEASE  01/2018   Left hand   VASECTOMY      Family History  Problem Relation Age of Onset   Diabetes Mother    Heart disease Mother    Hypertension Mother    Hypertension Other    Cancer Other        colon   Hypertension Father     Social History   Socioeconomic History   Marital status: Married    Spouse name: Not on file   Number of children: Not on file   Years of education: Not on file   Highest education level: Not on file  Occupational History   Not on file  Tobacco Use   Smoking status: Never Smoker   Smokeless tobacco: Never Used  Vaping Use   Vaping Use: Never used  Substance and Sexual Activity   Alcohol use: Yes   Drug use: No   Sexual activity: Not on file  Other Topics Concern   Not on file  Social History Narrative   Married no tobacco     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   Social Determinants of Health   Financial Resource Strain:    Difficulty of Paying Living Expenses: Not on file  Food Insecurity:    Worried About Programme researcher, broadcasting/film/video in the Last Year: Not on file   [employer] of Food in the Last Year: Not on file  Transportation Needs:    Lack of Transportation (Medical): Not on file   Lack of Transportation (Non-Medical): Not on file  Physical Activity:    Days of Exercise per Week: Not on file   Minutes of Exercise per Session: Not on file  Stress:    Feeling of Stress : Not on file  Social Connections:    Frequency of Communication with Friends and Family: Not on file   Frequency of Social Gatherings with Friends and Family: Not on file   Attends Religious Services: Not on file   Active Member of Clubs or Organizations: Not on file   Attends Banker Meetings: Not on file   Marital Status: Not on file  Intimate Partner Violence:    Fear of Current or Ex-Partner: Not on file   Emotionally Abused: Not on file  Physically Abused: Not on file   Sexually Abused: Not on file      No Known Allergies  ROS Review of Systems  Constitutional: Negative for fatigue.  Eyes: Negative for visual disturbance.  Respiratory: Negative for cough, chest tightness and shortness of breath.   Cardiovascular: Negative for chest pain, palpitations and leg swelling.  Neurological: Negative for dizziness, syncope, weakness, light-headedness and headaches.      Objective:    Physical Exam Constitutional:      Appearance: He is well-developed.  HENT:     Right Ear: External ear normal.     Left Ear: External ear normal.  Eyes:     Pupils: Pupils are equal, round, and reactive to light.  Neck:     Thyroid: No thyromegaly.  Cardiovascular:     Rate and Rhythm: Normal rate and regular rhythm.  Pulmonary:     Effort: Pulmonary effort is normal. No respiratory distress.      Breath sounds: Normal breath sounds. No wheezing or rales.  Musculoskeletal:     Cervical back: Neck supple.  Neurological:     Mental Status: He is alert and oriented to person, place, and time.     BP (!) 150/102    Pulse 96    Temp 98.1 F (36.7 C) (Oral)    Ht 5\' 10"  (1.778 m)    Wt 273 lb (123.8 kg)    SpO2 98%    BMI 39.17 kg/m  Wt Readings from Last 3 Encounters:  06/06/20 273 lb (123.8 kg)  03/08/19 270 lb 1.6 oz (122.5 kg)  08/11/18 254 lb 6.4 oz (115.4 kg)     Health Maintenance Due  Topic Date Due   COVID-19 Vaccine (1) Never done   HIV Screening  Never done   COLONOSCOPY  11/27/2019   INFLUENZA VACCINE  Never done    There are no preventive care reminders to display for this patient.  Lab Results  Component Value Date   TSH 2.55 03/01/2018   Lab Results  Component Value Date   WBC 7.1 03/01/2018   HGB 14.5 03/01/2018   HCT 42.1 03/01/2018   MCV 86.7 03/01/2018   PLT 224.0 03/01/2018   Lab Results  Component Value Date   NA 137 03/01/2018   K 4.4 03/01/2018   CO2 31 03/01/2018   GLUCOSE 97 03/01/2018   BUN 22 03/01/2018   CREATININE 0.96 03/01/2018   BILITOT 0.4 03/01/2018   ALKPHOS 72 03/01/2018   AST 19 03/01/2018   ALT 36 03/01/2018   PROT 7.2 03/01/2018   ALBUMIN 4.4 03/01/2018   CALCIUM 9.8 03/01/2018   GFR 85.40 03/01/2018   Lab Results  Component Value Date   CHOL 347 (H) 03/01/2018   Lab Results  Component Value Date   HDL 43.50 03/01/2018   Lab Results  Component Value Date   LDLCALC 171 (H) 04/02/2014   Lab Results  Component Value Date   TRIG (H) 03/01/2018    452.0 Triglyceride is over 400; calculations on Lipids are invalid.   Lab Results  Component Value Date   CHOLHDL 8 03/01/2018   No results found for: HGBA1C    Assessment & Plan:   #1 hypertension.  Poorly controlled today but patient has been out of medication for 5 days.  -Refill lisinopril HCTZ and after being on this for a couple weeks be in  touch if consistent blood pressure readings greater than 140/90 -  #2 history of severe dyslipidemia. -Patient will schedule  fasting lipid panel.  He was not fasting today.  Will again suggest statin if they were similar to last time.  #3 metabolic syndrome.  At risk for type 2 diabetes -Add A1c to labs drawn which he plans to get next week -He is encouraged to lose some weight  #4 colon cancer screening.  He had Cologuard 2017 which was negative.  No family history of colon cancer.  He would like to go ahead with scheduling colonoscopy  Meds ordered this encounter  Medications   lisinopril-hydrochlorothiazide (ZESTORETIC) 20-12.5 MG tablet    Sig: TAKE ONE TABLET BY MOUTH DAILY    Dispense:  90 tablet    Refill:  3    Follow-up: No follow-ups on file.    Evelena Peat, MD

## 2020-06-24 ENCOUNTER — Other Ambulatory Visit (INDEPENDENT_AMBULATORY_CARE_PROVIDER_SITE_OTHER): Payer: BC Managed Care – PPO

## 2020-06-24 ENCOUNTER — Other Ambulatory Visit: Payer: Self-pay

## 2020-06-24 DIAGNOSIS — E8881 Metabolic syndrome: Secondary | ICD-10-CM

## 2020-06-24 DIAGNOSIS — E785 Hyperlipidemia, unspecified: Secondary | ICD-10-CM

## 2020-06-24 DIAGNOSIS — I10 Essential (primary) hypertension: Secondary | ICD-10-CM | POA: Diagnosis not present

## 2020-06-25 LAB — LIPID PANEL
Cholesterol: 285 mg/dL — ABNORMAL HIGH (ref <200)
HDL: 40 mg/dL (ref >=40)
LDL Cholesterol (Calc): 185 mg/dL (calc) — ABNORMAL HIGH
Non-HDL Cholesterol (Calc): 245 mg/dL (calc) — ABNORMAL HIGH (ref <130)
Total CHOL/HDL Ratio: 7.1 (calc) — ABNORMAL HIGH (ref <5.0)
Triglycerides: 352 mg/dL — ABNORMAL HIGH (ref <150)

## 2020-06-25 LAB — HEPATIC FUNCTION PANEL
AG Ratio: 1.7 (calc) (ref 1.0–2.5)
ALT: 23 U/L (ref 9–46)
AST: 16 U/L (ref 10–35)
Albumin: 4.3 g/dL (ref 3.6–5.1)
Alkaline phosphatase (APISO): 63 U/L (ref 35–144)
Bilirubin, Direct: 0.1 mg/dL (ref 0.0–0.2)
Globulin: 2.6 g/dL (calc) (ref 1.9–3.7)
Indirect Bilirubin: 0.2 mg/dL (calc) (ref 0.2–1.2)
Total Bilirubin: 0.3 mg/dL (ref 0.2–1.2)
Total Protein: 6.9 g/dL (ref 6.1–8.1)

## 2020-06-25 LAB — BASIC METABOLIC PANEL
BUN: 17 mg/dL (ref 7–25)
CO2: 29 mmol/L (ref 20–32)
Calcium: 9.3 mg/dL (ref 8.6–10.3)
Chloride: 101 mmol/L (ref 98–110)
Creat: 1.1 mg/dL (ref 0.70–1.25)
Glucose, Bld: 100 mg/dL — ABNORMAL HIGH (ref 65–99)
Potassium: 4.4 mmol/L (ref 3.5–5.3)
Sodium: 138 mmol/L (ref 135–146)

## 2020-06-25 LAB — HEMOGLOBIN A1C
Hgb A1c MFr Bld: 5.8 % of total Hgb — ABNORMAL HIGH (ref <5.7)
Mean Plasma Glucose: 120 (calc)
eAG (mmol/L): 6.6 (calc)

## 2020-08-13 DIAGNOSIS — Z20822 Contact with and (suspected) exposure to covid-19: Secondary | ICD-10-CM | POA: Diagnosis not present

## 2020-08-13 DIAGNOSIS — Z03818 Encounter for observation for suspected exposure to other biological agents ruled out: Secondary | ICD-10-CM | POA: Diagnosis not present

## 2020-11-24 DIAGNOSIS — L739 Follicular disorder, unspecified: Secondary | ICD-10-CM | POA: Diagnosis not present

## 2020-11-24 DIAGNOSIS — L237 Allergic contact dermatitis due to plants, except food: Secondary | ICD-10-CM | POA: Diagnosis not present

## 2020-11-24 DIAGNOSIS — L821 Other seborrheic keratosis: Secondary | ICD-10-CM | POA: Diagnosis not present

## 2020-12-04 DIAGNOSIS — Z20822 Contact with and (suspected) exposure to covid-19: Secondary | ICD-10-CM | POA: Diagnosis not present

## 2021-01-23 ENCOUNTER — Telehealth: Payer: Self-pay | Admitting: Pulmonary Disease

## 2021-01-23 NOTE — Telephone Encounter (Signed)
Spoke with the pt  He states that he is needing order for new CPAP asap bc his is broken  I advised he will need to schedule appt  Not seen since 2019  He refused to make appt and states will ask his PCP  Nothing further needed

## 2021-02-25 DIAGNOSIS — L237 Allergic contact dermatitis due to plants, except food: Secondary | ICD-10-CM | POA: Diagnosis not present

## 2021-02-25 DIAGNOSIS — D2272 Melanocytic nevi of left lower limb, including hip: Secondary | ICD-10-CM | POA: Diagnosis not present

## 2021-02-25 DIAGNOSIS — L578 Other skin changes due to chronic exposure to nonionizing radiation: Secondary | ICD-10-CM | POA: Diagnosis not present

## 2021-02-25 DIAGNOSIS — L739 Follicular disorder, unspecified: Secondary | ICD-10-CM | POA: Diagnosis not present

## 2021-03-17 IMAGING — MR MRI LUMBAR SPINE WITHOUT CONTRAST
4 of 5 series · 26 of 48 positions shown · non-contrast
Comparison: None.

CLINICAL DATA: Left sciatic nerve pain. Lumbar radiculopathy with
left leg pain

EXAM:
MRI LUMBAR SPINE WITHOUT CONTRAST
TECHNIQUE: Multiplanar, multisequence MR imaging of the lumbar spine was
performed. No intravenous contrast was administered.

[Series 3: T2 post-contrast · sagittal · 4.0mm · 0.55mm/px · 6 of 14 slices shown]
[im 1/14]
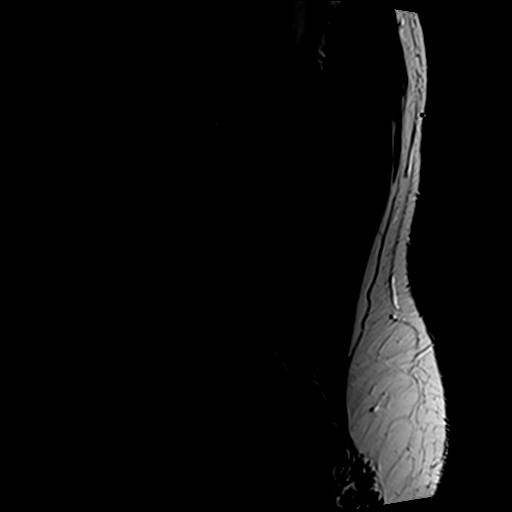
[im 3/14]
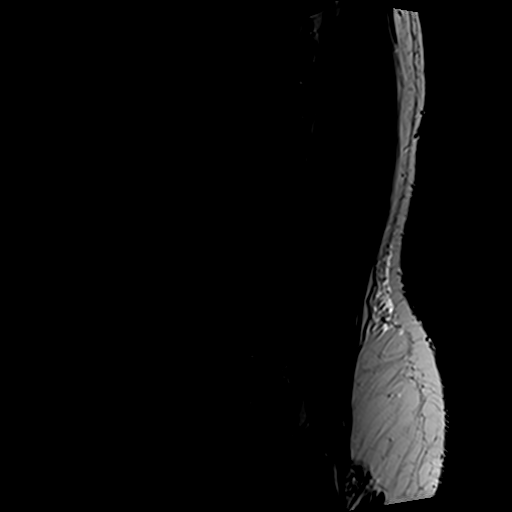
[im 6/14]
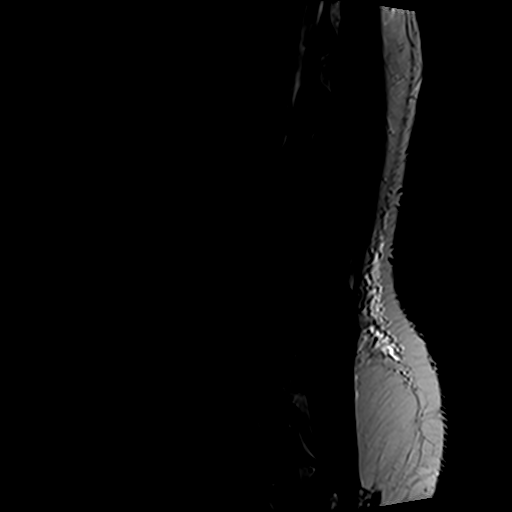
[im 8/14]
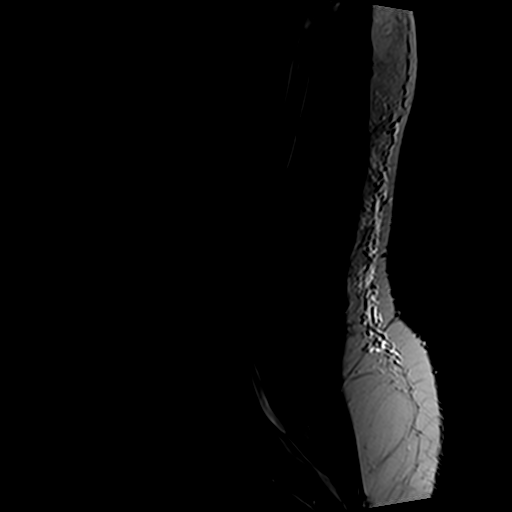
[im 11/14]
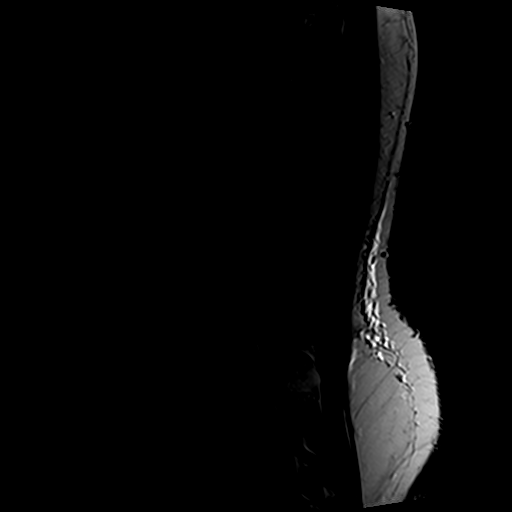
[im 14/14]
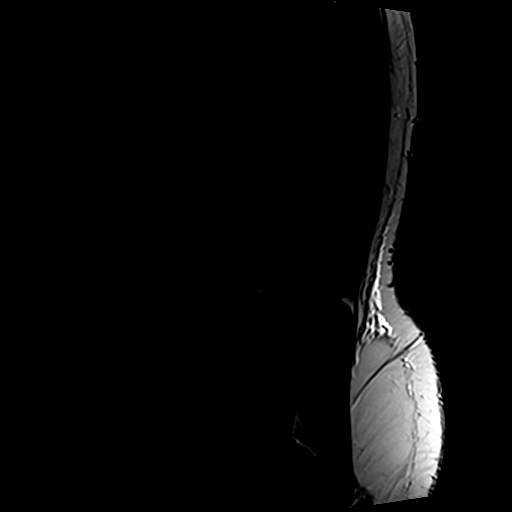

[Series 5: T1 · sagittal · 4.0mm · 0.55mm/px · 6 of 14 slices shown (1 of 2)]
[im 1/14]
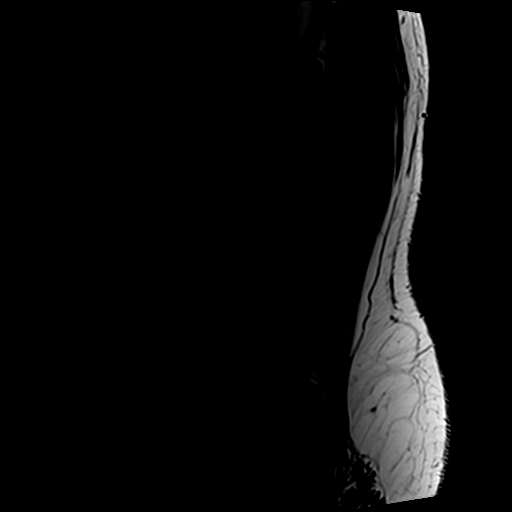
[im 3/14]
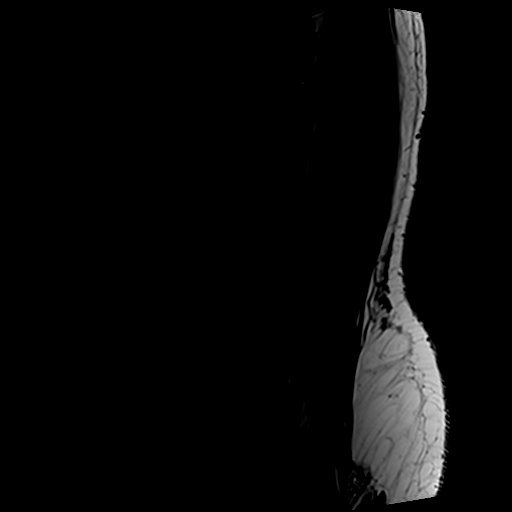
[im 6/14]
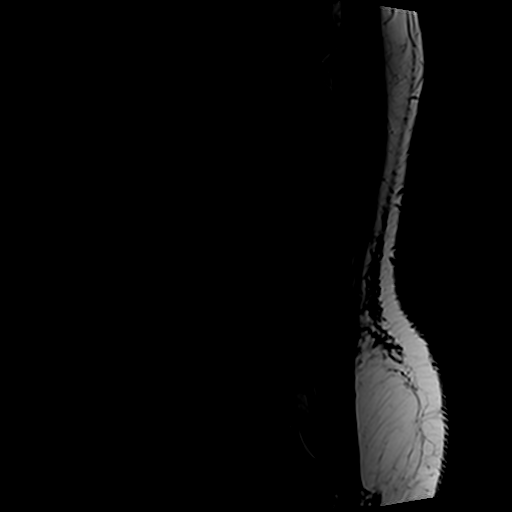
[im 8/14]
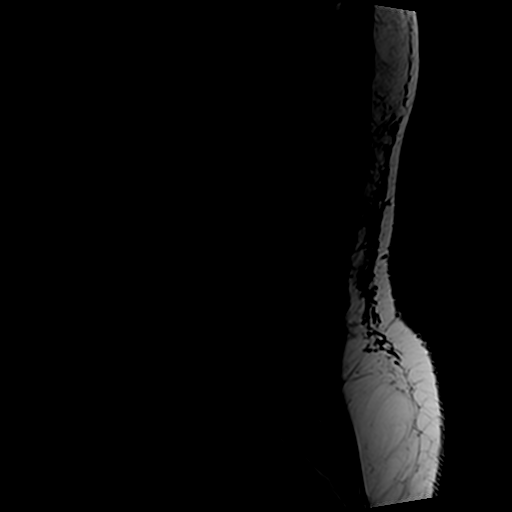
[im 11/14]
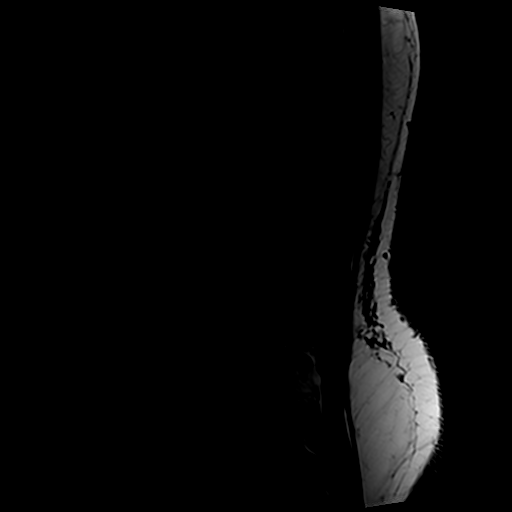
[im 14/14]
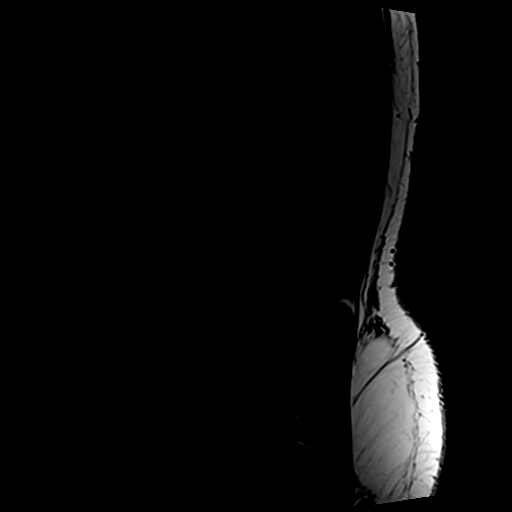

[Series 6: T2 · axial · 4.0mm · 0.78mm/px · z∈[-68,+154]mm · 9 of 37 slices shown]
[im 1/37]
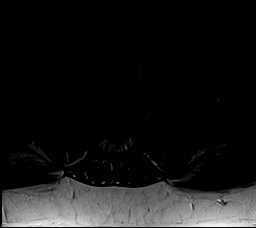
[im 6/37]
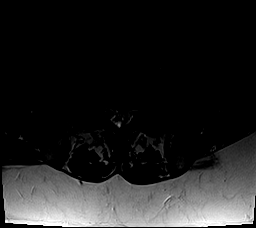
[im 11/37]
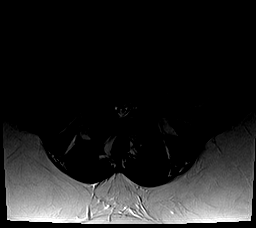
[im 16/37]
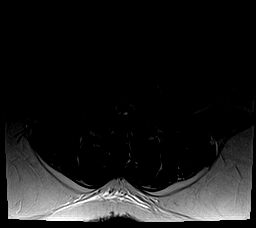
[im 19/37]
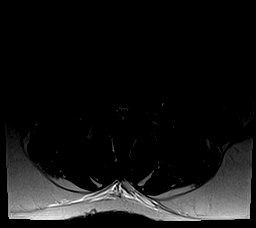
[im 21/37]
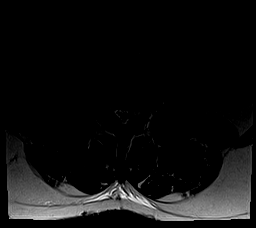
[im 26/37]
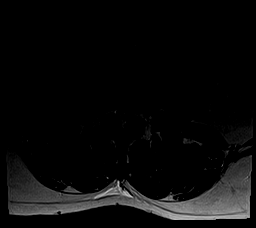
[im 31/37]
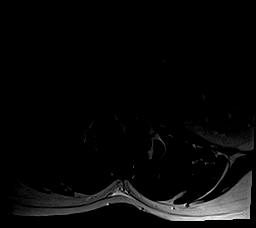
[im 37/37]
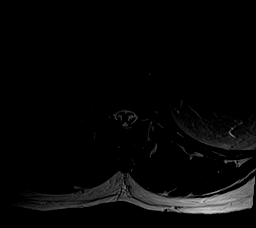

[Series 7: T1 · axial · 4.0mm · 0.39mm/px · z∈[-68,+109]mm · 5 of 37 slices shown (2 of 2)]
[im 1/37]
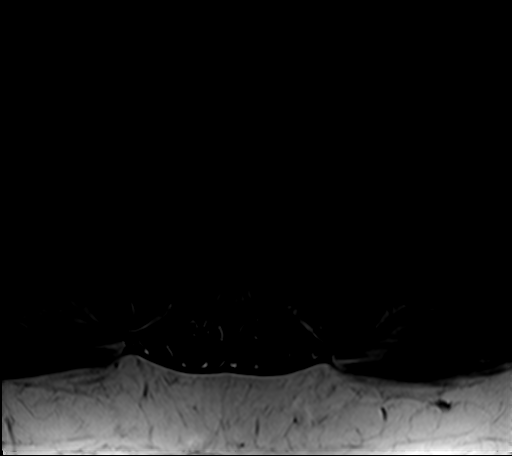
[im 6/37]
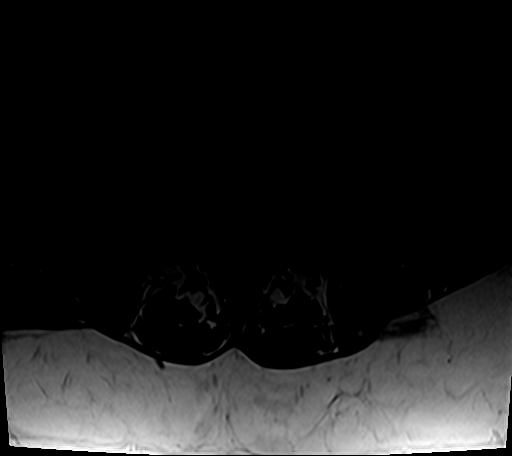
[im 11/37]
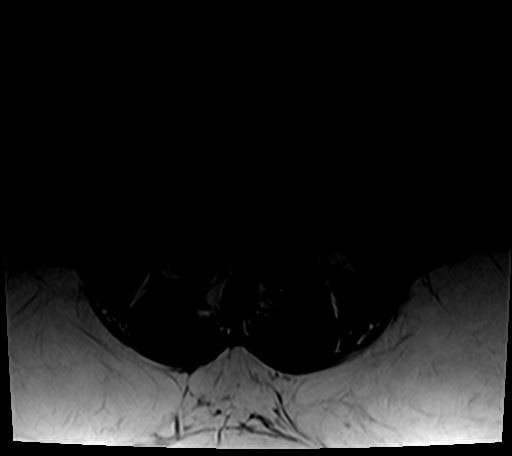
[im 19/37]
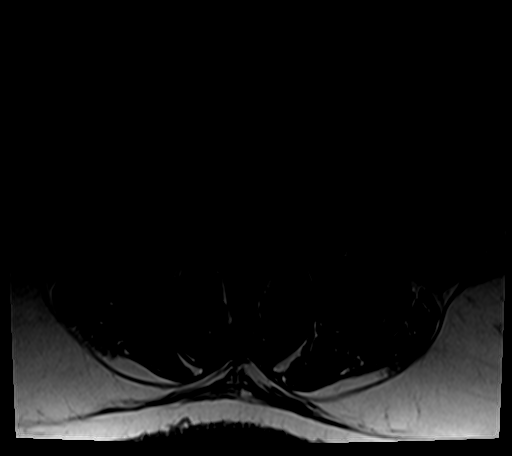
[im 31/37]
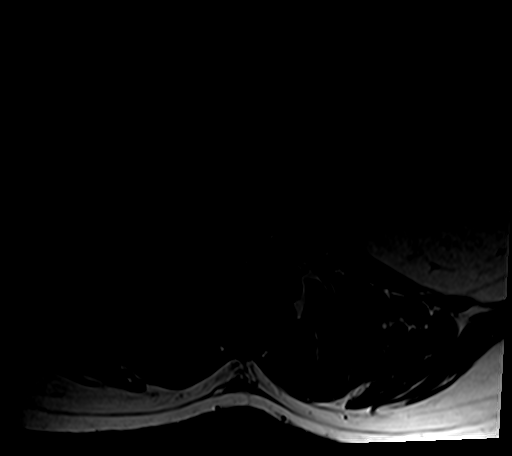

[26 of 48 positions shown; findings below may reference images not displayed]

FINDINGS: Segmentation:  Normal

Alignment:  Normal

Vertebrae:  Normal bone marrow.  Negative for fracture or mass.

Conus medullaris and cauda equina: Conus extends to the L1-2 level.
Conus and cauda equina appear normal.

Paraspinal and other soft tissues: Negative for paraspinous mass or
adenopathy

Disc levels:

Moderate congenital stenosis of the lumbar canal due to short
pedicles.

T12-L1: Negative

L1-2: Mild bulging of the disc and mild facet hypertrophy. Mild
spinal stenosis

L2-3: Mild disc degeneration. Moderate facet and ligamentum flavum
hypertrophy causing moderate spinal stenosis. Mild subarticular
stenosis left greater than right

L3-4: Mild disc bulging and moderate facet hypertrophy. Moderate
spinal stenosis.

L4-5: Severe spinal stenosis. Disc bulging and endplate spurring.
Severe facet and ligamentum flavum hypertrophy bilaterally. Severe
subarticular stenosis bilaterally.

L5-S1: Small central disc protrusion. Bilateral facet degeneration.
Moderate subarticular stenosis bilaterally which could affect the S1
nerve roots
IMPRESSION: Moderately severe congenital lumbar stenosis. Multilevel
degenerative change causing significant spinal stenosis throughout
the lumbar spine. Severe spinal stenosis at L4-5 with moderate
spinal stenosis at L2-3 and L3-4.

## 2021-04-30 DIAGNOSIS — Z20822 Contact with and (suspected) exposure to covid-19: Secondary | ICD-10-CM | POA: Diagnosis not present

## 2021-05-26 ENCOUNTER — Other Ambulatory Visit: Payer: Self-pay | Admitting: Family Medicine

## 2021-05-26 ENCOUNTER — Ambulatory Visit: Payer: BC Managed Care – PPO | Admitting: Family Medicine

## 2021-05-26 ENCOUNTER — Other Ambulatory Visit: Payer: Self-pay

## 2021-05-26 VITALS — BP 140/80 | HR 88 | Temp 98.2°F | Wt 261.1 lb

## 2021-05-26 DIAGNOSIS — Z0000 Encounter for general adult medical examination without abnormal findings: Secondary | ICD-10-CM

## 2021-05-26 DIAGNOSIS — M25562 Pain in left knee: Secondary | ICD-10-CM | POA: Diagnosis not present

## 2021-05-26 MED ORDER — PREDNISONE 10 MG PO TABS: ORAL_TABLET | ORAL | 0 refills | Status: DC

## 2021-05-26 NOTE — Progress Notes (Signed)
Established Patient Office Visit  Subjective:  Patient ID: Gregg Hamilton, male    DOB: [birth year]  Age: 60 y.o. MRN: [id number]  CC:  Chief Complaint  Patient presents with   Knee Pain    Left knee, x 1 week, painful to move, no known injury     HPI Gregg Hamilton presents for proximally 1 week history of acute left knee pain.  Pain is mostly along the superior aspect of the knee.  Some pain with walking.  He has some pain with process of flexion but no pain when fully flexed.  Does not recall any injury.  He has tried some ice, ibuprofen, elastic knee sleeve with minimal improvement.  Does not have any known history of gout or pseudogout.  No other arthralgias.  He did go on a cruise couple weeks ago and a lot of walking but pain did not start about a week after he got back.  No calf pain.  No thigh pain.  Past Medical History:  Diagnosis Date   Allergy    Depression    Heart murmur    Hyperlipidemia    Hypertension    Metabolic syndrome    Sleep apnea     Past Surgical History:  Procedure Laterality Date   CARPAL TUNNEL RELEASE  01/2018   Left hand   VASECTOMY      Family History  Problem Relation Age of Onset   Diabetes Mother    Heart disease Mother    Hypertension Mother    Hypertension Other    Cancer Other        colon   Hypertension Father     Social History   Socioeconomic History   Marital status: Married    Spouse name: Not on file   Number of children: Not on file   Years of education: Not on file   Highest education level: Not on file  Occupational History   Not on file  Tobacco Use   Smoking status: Never   Smokeless tobacco: Never  Vaping Use   Vaping Use: Never used  Substance and Sexual Activity   Alcohol use: Yes   Drug use: No   Sexual activity: Not on file  Other Topics Concern   Not on file  Social History Narrative   Married no tobacco    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      naproxen sodium (ALEVE) 220 MG tablet Take 220 mg by mouth.     No facility-administered medications prior to visit.    No Known Allergies  ROS Review of Systems  Constitutional:  Negative for chills and fever.  Respiratory:  Negative for shortness of breath.   Cardiovascular:  Negative for chest pain.  Neurological:  Negative for weakness and numbness.     Objective:    Physical Exam Vitals reviewed.  Constitutional:      Appearance: Normal appearance.  Cardiovascular:  Rate and Rhythm: Normal rate and regular rhythm.  Pulmonary:     Effort: Pulmonary effort is normal.     Breath sounds: Normal breath sounds.  Musculoskeletal:     Comments: Left knee reveals slight warmth compared to the right.  There is no erythema.  No ecchymosis.  No definite effusion.  No medial or lateral joint line tenderness.  He has some mild tenderness to palpation of the superior aspect of the patella.  Neurological:     Mental Status: He is alert.    BP 140/80 (BP Location: Left Arm, Patient Position: Sitting, Cuff Size: Normal)   Pulse 88   Temp 98.2 F (36.8 C) (Oral)   Wt 261 lb 1.6 oz (118.4 kg)   SpO2 95%   BMI 37.46 kg/m  Wt Readings from Last 3 Encounters:  05/26/21 261 lb 1.6 oz (118.4 kg)  06/06/20 273 lb (123.8 kg)  03/08/19 270 lb 1.6 oz (122.5 kg)     Health Maintenance Due  Topic Date Due   COVID-19 Vaccine (1) Never done   HIV Screening  Never done   Zoster Vaccines- Shingrix (1 of 2)  Never done   COLONOSCOPY (Pts 45-17yrs Insurance coverage will need to be confirmed)  11/27/2019   INFLUENZA VACCINE  04/27/2021    There are no preventive care reminders to display for this patient.  Lab Results  Component Value Date   TSH 2.55 03/01/2018   Lab Results  Component Value Date   WBC 7.1 03/01/2018   HGB 14.5 03/01/2018   HCT 42.1 03/01/2018   MCV 86.7 03/01/2018   PLT 224.0 03/01/2018   Lab Results  Component Value Date   NA 138 06/24/2020   K 4.4 06/24/2020   CO2 29 06/24/2020   GLUCOSE 100 (H) 06/24/2020   BUN 17 06/24/2020   CREATININE 1.10 06/24/2020   BILITOT 0.3 06/24/2020   ALKPHOS 72 03/01/2018   AST 16 06/24/2020   ALT 23 06/24/2020   PROT 6.9 06/24/2020   ALBUMIN 4.4 03/01/2018   CALCIUM 9.3 06/24/2020   GFR 85.40 03/01/2018   Lab Results  Component Value Date   CHOL 285 (H) 06/24/2020   Lab Results  Component Value Date   HDL 40 06/24/2020   Lab Results  Component Value Date   LDLCALC 185 (H) 06/24/2020   Lab Results  Component Value Date   TRIG 352 (H) 06/24/2020   Lab Results  Component Value Date   CHOLHDL 7.1 (H) 06/24/2020   Lab Results  Component Value Date   HGBA1C 5.8 (H) 06/24/2020      Assessment & Plan:   Acute inflammation left knee.  No reported injury.  Pain seems to be more along the superior aspect of the patella and he does not have any definite effusion.  There is no erythema to suggest any cellulitis changes.  No known history of gout or pseudogout.  -Recommend trial of prednisone taper. -Continue icing -Be in touch if symptoms not greatly improved over the next couple of days   Meds ordered this encounter  Medications   predniSONE (DELTASONE) 10 MG tablet    Sig: Taper as follows over 10 days;  6-6-4-4-3-3-2-2-1-1    Dispense:  32 tablet    Refill:  0    Follow-up: No follow-ups on file.    Evelena Peat, MD

## 2021-05-26 NOTE — Patient Instructions (Signed)
Let me know by next week if knee not improving and sooner for any increased swelling or redness.

## 2021-07-01 ENCOUNTER — Encounter: Payer: Self-pay | Admitting: Gastroenterology

## 2021-07-01 ENCOUNTER — Other Ambulatory Visit: Payer: Self-pay

## 2021-07-01 ENCOUNTER — Ambulatory Visit (INDEPENDENT_AMBULATORY_CARE_PROVIDER_SITE_OTHER): Payer: BC Managed Care – PPO | Admitting: Family Medicine

## 2021-07-01 ENCOUNTER — Encounter: Payer: Self-pay | Admitting: Family Medicine

## 2021-07-01 VITALS — BP 124/70 | HR 65 | Temp 97.5°F | Ht 70.0 in | Wt 245.1 lb

## 2021-07-01 DIAGNOSIS — Z0000 Encounter for general adult medical examination without abnormal findings: Secondary | ICD-10-CM | POA: Diagnosis not present

## 2021-07-01 DIAGNOSIS — Z1211 Encounter for screening for malignant neoplasm of colon: Secondary | ICD-10-CM

## 2021-07-01 LAB — BASIC METABOLIC PANEL
BUN: 16 mg/dL (ref 6–23)
CO2: 30 mEq/L (ref 19–32)
Calcium: 9.7 mg/dL (ref 8.4–10.5)
Chloride: 99 mEq/L (ref 96–112)
Creatinine, Ser: 1.04 mg/dL (ref 0.40–1.50)
GFR: 77.4 mL/min (ref >60.00)
Glucose, Bld: 92 mg/dL (ref 70–99)
Potassium: 4.2 mEq/L (ref 3.5–5.1)
Sodium: 137 mEq/L (ref 135–145)

## 2021-07-01 LAB — CBC WITH DIFFERENTIAL/PLATELET
Basophils Absolute: 0 10*3/uL (ref 0.0–0.1)
Basophils Relative: 0.6 % (ref 0.0–3.0)
Eosinophils Absolute: 0.1 10*3/uL (ref 0.0–0.7)
Eosinophils Relative: 2 % (ref 0.0–5.0)
HCT: 41.2 % (ref 39.0–52.0)
Hemoglobin: 13.8 g/dL (ref 13.0–17.0)
Lymphocytes Relative: 34.4 % (ref 12.0–46.0)
Lymphs Abs: 1.5 10*3/uL (ref 0.7–4.0)
MCHC: 33.5 g/dL (ref 30.0–36.0)
MCV: 87.3 fl (ref 78.0–100.0)
Monocytes Absolute: 0.4 10*3/uL (ref 0.1–1.0)
Monocytes Relative: 8.6 % (ref 3.0–12.0)
Neutro Abs: 2.4 10*3/uL (ref 1.4–7.7)
Neutrophils Relative %: 54.4 % (ref 43.0–77.0)
Platelets: 211 10*3/uL (ref 150.0–400.0)
RBC: 4.73 Mil/uL (ref 4.22–5.81)
RDW: 14.1 % (ref 11.5–15.5)
WBC: 4.5 10*3/uL (ref 4.0–10.5)

## 2021-07-01 LAB — LIPID PANEL
Cholesterol: 192 mg/dL (ref 0–200)
HDL: 46.4 mg/dL (ref >39.00)
LDL Cholesterol: 125 mg/dL — ABNORMAL HIGH (ref 0–99)
NonHDL: 146.02
Total CHOL/HDL Ratio: 4
Triglycerides: 104 mg/dL (ref 0.0–149.0)
VLDL: 20.8 mg/dL (ref 0.0–40.0)

## 2021-07-01 LAB — HEPATIC FUNCTION PANEL
ALT: 27 U/L (ref 0–53)
AST: 19 U/L (ref 0–37)
Albumin: 4.4 g/dL (ref 3.5–5.2)
Alkaline Phosphatase: 48 U/L (ref 39–117)
Bilirubin, Direct: 0.1 mg/dL (ref 0.0–0.3)
Total Bilirubin: 0.5 mg/dL (ref 0.2–1.2)
Total Protein: 6.9 g/dL (ref 6.0–8.3)

## 2021-07-01 LAB — HEMOGLOBIN A1C: Hgb A1c MFr Bld: 5.9 % (ref 4.6–6.5)

## 2021-07-01 LAB — PSA: PSA: 0.97 ng/mL (ref 0.10–4.00)

## 2021-07-01 LAB — TSH: TSH: 2.85 u[IU]/mL (ref 0.35–5.50)

## 2021-07-01 NOTE — Addendum Note (Signed)
Addended by: Kandra Nicolas on: 07/01/2021 08:15 AM   Modules accepted: Orders

## 2021-07-01 NOTE — Progress Notes (Signed)
Established Patient Office Visit  Subjective:  Patient ID: Gregg Hamilton, male    DOB: 01/01/1961  Age: 60 y.o. MRN: [id number]  CC:  Chief Complaint  Patient presents with   Annual Exam    HPI Gregg Hamilton presents for annual physical exam.  Generally doing well.  He is lost some weight this year due to his efforts.  He has very strong family history of type 2 diabetes including mother, father, and sister.  He does monitor his blood sugars at home and usually gets fastings around 98-100 range.  A1c last year 5.8%.  Health maintenance reviewed  He does need repeat colonoscopy.  He did Cologuard about 5 years ago which was negative and last colonoscopy was about 11 years ago.  No history of shingles vaccine but declines.  Declines flu vaccine.  Tetanus up-to-date.  Family history reviewed-mother had hypertension and diabetes.  Father had both as well but lived until 36.  Mother died of complications of type 2 diabetes.  Sister with type 2 diabetes as well.  No family history of cancer.  Social history-still works full-time.  He works for [employer].  Non-smoker.  Occasional beer use.  He is married.  Past Medical History:  Diagnosis Date   Allergy    Depression    Heart murmur    Hyperlipidemia    Hypertension    Metabolic syndrome    Sleep apnea     Past Surgical History:  Procedure Laterality Date   CARPAL TUNNEL RELEASE  01/2018   Left hand   VASECTOMY      Family History  Problem Relation Age of Onset   Diabetes Mother    Hypertension Mother    Diabetes Father    Hypertension Father    Diabetes Sister    Hypertension Other    Cancer Other        colon    Social History   Socioeconomic History   Marital status: Married    Spouse name: Not on file   Number of children: Not on file   Years of education: Not on file   Highest education level: Not on file  Occupational History   Not on file  Tobacco Use   Smoking status: Never   Smokeless  tobacco: Never  Vaping Use   Vaping Use: Never used  Substance and Sexual Activity   Alcohol use: Yes   Drug use: No   Sexual activity: Not on file  Other Topics Concern   Not on file  Social History Narrative   Married no tobacco    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   Social Determinants of Corporate investment banker Strain: Not on file  Food Insecurity: Not on file  Transportation Needs: Not on file  Physical Activity: Not on file  Stress: Not on file  Social Connections: Not on file  Intimate Partner Violence: Not on file    Outpatient Medications Prior to Visit  Medication Sig Dispense Refill   lisinopril-hydrochlorothiazide (ZESTORETIC) 20-12.5 MG tablet TAKE ONE TABLET BY MOUTH DAILY 90 tablet 3   predniSONE (DELTASONE) 10 MG tablet Taper as follows over 10 days;  6-6-4-4-3-3-2-2-1-1 32 tablet 0   No facility-administered medications prior to visit.    No Known Allergies  ROS Review of Systems  Constitutional:  Negative for activity change, appetite change, fatigue and fever.  HENT:  Negative for congestion, ear pain and trouble swallowing.   Eyes:  Negative for  pain and visual disturbance.  Respiratory:  Negative for cough, shortness of breath and wheezing.   Cardiovascular:  Negative for chest pain and palpitations.  Gastrointestinal:  Negative for abdominal distention, abdominal pain, blood in stool, constipation, diarrhea, nausea, rectal pain and vomiting.  Genitourinary:  Negative for dysuria, hematuria and testicular pain.  Musculoskeletal:  Negative for arthralgias and joint swelling.  Skin:  Negative for rash.  Neurological:  Negative for dizziness, syncope and headaches.  Hematological:  Negative for adenopathy.  Psychiatric/Behavioral:  Negative for confusion and dysphoric mood.      Objective:    Physical Exam Constitutional:      General: He is not in acute distress.    Appearance: He is well-developed.  HENT:     Head:  Normocephalic and atraumatic.     Right Ear: External ear normal.     Left Ear: External ear normal.  Eyes:     Conjunctiva/sclera: Conjunctivae normal.     Pupils: Pupils are equal, round, and reactive to light.  Neck:     Thyroid: No thyromegaly.  Cardiovascular:     Rate and Rhythm: Normal rate and regular rhythm.     Heart sounds: Normal heart sounds. No murmur heard. Pulmonary:     Effort: No respiratory distress.     Breath sounds: No wheezing or rales.  Abdominal:     General: Bowel sounds are normal. There is no distension.     Palpations: Abdomen is soft. There is no mass.     Tenderness: There is no abdominal tenderness. There is no guarding or rebound.  Musculoskeletal:     Cervical back: Normal range of motion and neck supple.     Right lower leg: No edema.     Left lower leg: No edema.  Lymphadenopathy:     Cervical: No cervical adenopathy.  Skin:    Findings: No rash.  Neurological:     Mental Status: He is alert and oriented to person, place, and time.     Cranial Nerves: No cranial nerve deficit.     Deep Tendon Reflexes: Reflexes normal.    BP 124/70 (BP Location: Left Arm, Patient Position: Sitting, Cuff Size: Normal)   Pulse 65   Temp (!) 97.5 F (36.4 C) (Oral)   Ht 5\' 10"  (1.778 m)   Wt 245 lb 1.6 oz (111.2 kg)   SpO2 98%   BMI 35.17 kg/m  Wt Readings from Last 3 Encounters:  07/01/21 245 lb 1.6 oz (111.2 kg)  05/26/21 261 lb 1.6 oz (118.4 kg)  06/06/20 273 lb (123.8 kg)     Health Maintenance Due  Topic Date Due   COVID-19 Vaccine (1) Never done   HIV Screening  Never done   COLONOSCOPY (Pts 45-31yrs Insurance coverage will need to be confirmed)  11/27/2019    There are no preventive care reminders to display for this patient.  Lab Results  Component Value Date   TSH 2.55 03/01/2018   Lab Results  Component Value Date   WBC 7.1 03/01/2018   HGB 14.5 03/01/2018   HCT 42.1 03/01/2018   MCV 86.7 03/01/2018   PLT 224.0 03/01/2018    Lab Results  Component Value Date   NA 138 06/24/2020   K 4.4 06/24/2020   CO2 29 06/24/2020   GLUCOSE 100 (H) 06/24/2020   BUN 17 06/24/2020   CREATININE 1.10 06/24/2020   BILITOT 0.3 06/24/2020   ALKPHOS 72 03/01/2018   AST 16 06/24/2020   ALT 23 06/24/2020  PROT 6.9 06/24/2020   ALBUMIN 4.4 03/01/2018   CALCIUM 9.3 06/24/2020   GFR 85.40 03/01/2018   Lab Results  Component Value Date   CHOL 285 (H) 06/24/2020   Lab Results  Component Value Date   HDL 40 06/24/2020   Lab Results  Component Value Date   LDLCALC 185 (H) 06/24/2020   Lab Results  Component Value Date   TRIG 352 (H) 06/24/2020   Lab Results  Component Value Date   CHOLHDL 7.1 (H) 06/24/2020   Lab Results  Component Value Date   HGBA1C 5.8 (H) 06/24/2020      Assessment & Plan:   Problem List Items Addressed This Visit   None Visit Diagnoses     Physical exam    -  Primary   Colon cancer screening       Relevant Orders   Ambulatory referral to Gastroenterology   Basic metabolic panel   Lipid panel   CBC with Differential/Platelet   TSH   Hepatic function panel   Hemoglobin A1c   PSA      He is done a good job with weight loss during the past year which is very important with regard to his strong family history of type 2 diabetes.  We recommend labs above including A1c and PSA screening.  We set up repeat colonoscopy  Offered flu vaccine and Shingrix and he declines both. No orders of the defined types were placed in this encounter.   Follow-up: No follow-ups on file.    Evelena Peat, MD

## 2021-07-01 NOTE — Patient Instructions (Signed)
Consider Shingrix vaccine- and let us know if interested   

## 2021-07-24 DIAGNOSIS — R9402 Abnormal brain scan: Secondary | ICD-10-CM | POA: Diagnosis not present

## 2021-07-24 DIAGNOSIS — Z5329 Procedure and treatment not carried out because of patient's decision for other reasons: Secondary | ICD-10-CM | POA: Diagnosis not present

## 2021-07-24 DIAGNOSIS — E876 Hypokalemia: Secondary | ICD-10-CM | POA: Diagnosis not present

## 2021-07-24 DIAGNOSIS — R918 Other nonspecific abnormal finding of lung field: Secondary | ICD-10-CM | POA: Diagnosis not present

## 2021-07-24 DIAGNOSIS — I10 Essential (primary) hypertension: Secondary | ICD-10-CM | POA: Diagnosis not present

## 2021-07-24 DIAGNOSIS — R930 Abnormal findings on diagnostic imaging of skull and head, not elsewhere classified: Secondary | ICD-10-CM | POA: Diagnosis not present

## 2021-07-24 DIAGNOSIS — R55 Syncope and collapse: Secondary | ICD-10-CM | POA: Diagnosis not present

## 2021-07-28 ENCOUNTER — Other Ambulatory Visit: Payer: Self-pay

## 2021-07-28 ENCOUNTER — Ambulatory Visit: Payer: BC Managed Care – PPO | Admitting: Family Medicine

## 2021-07-28 VITALS — BP 140/60 | HR 63 | Temp 98.0°F | Wt 236.9 lb

## 2021-07-28 DIAGNOSIS — E876 Hypokalemia: Secondary | ICD-10-CM | POA: Diagnosis not present

## 2021-07-28 DIAGNOSIS — R55 Syncope and collapse: Secondary | ICD-10-CM

## 2021-07-28 DIAGNOSIS — R930 Abnormal findings on diagnostic imaging of skull and head, not elsewhere classified: Secondary | ICD-10-CM

## 2021-07-28 NOTE — Progress Notes (Signed)
Established Patient Office Visit  Subjective:  Patient ID: Gregg Hamilton, male    DOB: [birth year]  Age: 60 y.o. MRN: [id number]  CC:  Chief Complaint  Patient presents with   Hospitalization Follow-up    HPI Gregg Hamilton presents for follow-up from recent syncopal episode.  Last week he was in [facility].  He was in a [facility] and recalls feeling dizzy and had somewhat of a "hot "sensation all over.  His next awareness was he was on the floor and had passed out.  Paramedics were summoned.  According to observers he apparently had 2 other brief syncopal episodes when he tried to come back around.  He states his glucose was 111.  Blood pressure from paramedics initially 99/55.  He was taken to local emergency room for further evaluation.  He does bring in some records at this time.  He had labs which showed potassium 2.9.  White count 11.7.  Troponins negative.  Chest x-ray no acute findings.  CT head showed 3 mm area of hyperdensity in the left cerebellum felt to represent either calcification versus cavernous malformation versus small hemorrhage.  Recommendation from radiology was to get follow-up head CT in 3 to 4 days to reassess.  He states his EKG was unremarkable.  He was monitored in the ER and no arrhythmias were noted.  There was no suspicion for seizure activity.  Potassium was low at 2.9.  He is on lisinopril HCTZ.  He states he may have been little bit depleted that day as he had not been eating a lot.  Patient was apparently given some potassium in the ER.  He did leave AGAINST MEDICAL ADVICE-or according to records that he brought a copy of in with him today.  Generally feels well at this time.  No recurrent dizziness or syncope.  No chest pains.  No dyspnea.  No recent fever.  His ER evaluation was at the [facility] [address] [place] [address]  Past Medical History:  Diagnosis Date   Allergy    Depression    Heart murmur     Hyperlipidemia    Hypertension    Metabolic syndrome    Sleep apnea     Past Surgical History:  Procedure Laterality Date   CARPAL TUNNEL RELEASE  01/2018   Left hand   VASECTOMY      Family History  Problem Relation Age of Onset   Diabetes Mother    Hypertension Mother    Diabetes Father    Hypertension Father    Diabetes Sister    Hypertension Other    Cancer Other        colon    Social History   Socioeconomic History   Marital status: Married    Spouse name: Not on file   Number of children: Not on file   Years of education: Not on file   Highest education level: Not on file  Occupational History   Not on file  Tobacco Use   Smoking status: Never   Smokeless tobacco: Never  Vaping Use   Vaping Use: Never used  Substance and Sexual Activity   Alcohol use: Yes   Drug use: No   Sexual activity: Not on file  Other Topics Concern   Not on file  Social History Narrative   Married no tobacco    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    No Known Allergies  ROS Review of Systems  Constitutional:  Negative for fatigue.  Eyes:  Negative for visual disturbance.  Respiratory:  Negative for cough, chest tightness and shortness of breath.   Cardiovascular:  Negative for chest pain, palpitations and leg swelling.  Neurological:  Negative for dizziness, syncope, weakness, light-headedness and headaches.     Objective:    Physical  Exam Constitutional:      Appearance: He is well-developed.  HENT:     Right Ear: External ear normal.     Left Ear: External ear normal.  Eyes:     Pupils: Pupils are equal, round, and reactive to light.  Neck:     Thyroid: No thyromegaly.  Cardiovascular:     Rate and Rhythm: Normal rate and regular rhythm.  Pulmonary:     Effort: Pulmonary effort is normal. No respiratory distress.     Breath sounds: Normal breath sounds. No wheezing or rales.  Musculoskeletal:     Cervical back: Neck supple.     Right lower leg: No edema.     Left lower leg: No edema.  Neurological:     General: No focal deficit present.     Mental Status: He is alert and oriented to person, place, and time.     Cranial Nerves: No cranial nerve deficit.     Motor: No weakness.     Coordination: Coordination normal.     Gait: Gait normal.     Deep Tendon Reflexes: Reflexes normal.  Psychiatric:        Mood and Affect: Mood normal.    BP 140/60 (BP Location: Left Arm, Patient Position: Sitting, Cuff Size: Normal)   Pulse 63   Temp 98 F (36.7 C) (Oral)   Wt 236 lb 14.4 oz (107.5 kg)   SpO2 98%   BMI 33.99 kg/m  Wt Readings from Last 3 Encounters:  07/28/21 236 lb 14.4 oz (107.5 kg)  07/01/21 245 lb 1.6 oz (111.2 kg)  05/26/21 261 lb 1.6 oz (118.4 kg)     Health Maintenance Due  Topic Date Due   COVID-19 Vaccine (1) Never done   Pneumococcal Vaccine 54-39 Years old (1 - PCV) Never done   HIV Screening  Never done   COLONOSCOPY (Pts 45-40yrs Insurance coverage will need to be confirmed)  11/27/2019    There are no preventive care reminders to display for this patient.  Lab Results  Component Value Date   TSH 2.85 07/01/2021   Lab Results  Component Value Date   WBC 4.5 07/01/2021   HGB 13.8 07/01/2021   HCT 41.2 07/01/2021   MCV 87.3 07/01/2021   PLT 211.0 07/01/2021   Lab Results  Component Value Date   NA 137 07/01/2021   K 4.2 07/01/2021   CO2 30 07/01/2021   GLUCOSE 92  07/01/2021   BUN 16 07/01/2021   CREATININE 1.04 07/01/2021   BILITOT 0.5 07/01/2021   ALKPHOS 48 07/01/2021   AST 19 07/01/2021   ALT 27 07/01/2021   PROT  6.9 07/01/2021   ALBUMIN 4.4 07/01/2021   CALCIUM 9.7 07/01/2021   GFR 77.40 07/01/2021   Lab Results  Component Value Date   CHOL 192 07/01/2021   Lab Results  Component Value Date   HDL 46.40 07/01/2021   Lab Results  Component Value Date   LDLCALC 125 (H) 07/01/2021   Lab Results  Component Value Date   TRIG 104.0 07/01/2021   Lab Results  Component Value Date   CHOLHDL 4 07/01/2021   Lab Results  Component Value Date   HGBA1C 5.9 07/01/2021      Assessment & Plan:   Problem List Items Addressed This Visit   None Visit Diagnoses     Syncope, unspecified syncope type    -  Primary   Hypokalemia       Relevant Orders   Basic metabolic panel   Abnormal CT of the head       Relevant Orders   CT HEAD WO CONTRAST (5MM)     Patient as above had recent syncopal episodes when in [place].  Work-up revealed potassium 2.9.  No other major lab abnormalities.  CT head showed 3 mm left cerebellum hyperdensity.  Recommendation was to repeat CT head in 3 to 4 days.  Patient is clinically improved at this time.  Denies any orthostatic symptoms.  Repeat blood pressure today seated and standing does not show any orthostatic change.  -Repeat basic metabolic panel -Schedule repeat CT head without contrast to reassess abnormality on recent CT scan with special attention to the left cerebellum to follow-up reported 3 mm hyperdensity from previous scan on 07-24-2021  No orders of the defined types were placed in this encounter.   Follow-up: No follow-ups on file.    Carolann Littler, MD

## 2021-07-29 LAB — BASIC METABOLIC PANEL
BUN: 15 mg/dL (ref 6–23)
CO2: 31 mEq/L (ref 19–32)
Calcium: 9.5 mg/dL (ref 8.4–10.5)
Chloride: 100 mEq/L (ref 96–112)
Creatinine, Ser: 0.99 mg/dL (ref 0.40–1.50)
GFR: 82.07 mL/min (ref >60.00)
Glucose, Bld: 95 mg/dL (ref 70–99)
Potassium: 4 mEq/L (ref 3.5–5.1)
Sodium: 138 mEq/L (ref 135–145)

## 2021-08-24 ENCOUNTER — Other Ambulatory Visit: Payer: Self-pay

## 2021-08-24 ENCOUNTER — Encounter: Payer: Self-pay | Admitting: Gastroenterology

## 2021-08-24 ENCOUNTER — Ambulatory Visit (AMBULATORY_SURGERY_CENTER): Payer: Self-pay

## 2021-08-24 VITALS — Ht 70.0 in | Wt 226.0 lb

## 2021-08-24 DIAGNOSIS — Z1211 Encounter for screening for malignant neoplasm of colon: Secondary | ICD-10-CM

## 2021-08-24 MED ORDER — NA SULFATE-K SULFATE-MG SULF 17.5-3.13-1.6 GM/177ML PO SOLN: 1.0000 | Freq: Once | ORAL | 0 refills | Status: AC

## 2021-08-24 NOTE — Progress Notes (Signed)
Denies allergies to eggs or soy products. Denies complication of anesthesia or sedation. Denies use of weight loss medication. Denies use of O2.   Emmi instructions given for colonoscopy.  

## 2021-09-04 ENCOUNTER — Other Ambulatory Visit: Payer: Self-pay

## 2021-09-04 ENCOUNTER — Encounter: Payer: Self-pay | Admitting: Gastroenterology

## 2021-09-04 ENCOUNTER — Ambulatory Visit (AMBULATORY_SURGERY_CENTER): Payer: BC Managed Care – PPO | Admitting: Gastroenterology

## 2021-09-04 VITALS — BP 127/77 | HR 65 | Temp 98.6°F | Resp 15 | Ht 70.0 in | Wt 226.0 lb

## 2021-09-04 DIAGNOSIS — Z1211 Encounter for screening for malignant neoplasm of colon: Secondary | ICD-10-CM

## 2021-09-04 MED ORDER — SODIUM CHLORIDE 0.9 % IV SOLN: 500.0000 mL | Freq: Once | INTRAVENOUS | Status: DC

## 2021-09-04 NOTE — Progress Notes (Signed)
Abdomen soft, pt. Ver Comfortable at time of discharge, denies any abdominal discomfort.Marland Kitchen

## 2021-09-04 NOTE — Op Note (Signed)
[facility] Patient Name: Gregg Hamilton Procedure Date: 09/04/2021 11:42 AM MRN: [id number] Endoscopist: Viviann Spare P. Adela Lank , MD Age: 60 Referring MD:  Date of Birth: 02/09/1961 Gender: Male Account #: [id number] Procedure:                Colonoscopy Indications:              Screening for colorectal malignant neoplasm -                            negative colonoscopy 11/2009 Medicines:                Monitored Anesthesia Care Procedure:                Pre-Anesthesia Assessment:                           - Prior to the procedure, a History and Physical                            was performed, and patient medications and                            allergies were reviewed. The patient's tolerance of                            previous anesthesia was also reviewed. The risks                            and benefits of the procedure and the sedation                            options and risks were discussed with the patient.                            All questions were answered, and informed consent                            was obtained. Prior Anticoagulants: The patient has                            taken no previous anticoagulant or antiplatelet                            agents. ASA Grade Assessment: III - A patient with                            severe systemic disease. After reviewing the risks                            and benefits, the patient was deemed in                            satisfactory condition to undergo the procedure.  After obtaining informed consent, the colonoscope                            was passed under direct vision. Throughout the                            procedure, the patient's blood pressure, pulse, and                            oxygen saturations were monitored continuously. The                            CF [id number] #[id number] was introduced through the anus                            and advanced to the the  cecum, identified by                            appendiceal orifice and ileocecal valve. The                            colonoscopy was performed without difficulty. The                            patient tolerated the procedure well. The quality                            of the bowel preparation was adequate. The                            ileocecal valve, appendiceal orifice, and rectum                            were photographed. Scope In: 11:57:41 AM Scope Out: 12:20:06 PM Scope Withdrawal Time: 0 hours 20 minutes 24 seconds  Total Procedure Duration: 0 hours 22 minutes 25 seconds  Findings:                 The perianal and digital rectal examinations were                            normal.                           Anal papilla(e) were hypertrophied.                           Internal hemorrhoids were found during                            retroflexion. The hemorrhoids were small.                           The exam was otherwise without abnormality. Several  minutes spent lavaging the right colon to achieve                            adequate views. Complications:            No immediate complications. Estimated blood loss:                            None. Estimated Blood Loss:     Estimated blood loss: none. Impression:               - Anal papilla(e) were hypertrophied.                           - Internal hemorrhoids.                           - The examination was otherwise normal.                           - No polyps. Recommendation:           - Patient has a contact number available for                            emergencies. The signs and symptoms of potential                            delayed complications were discussed with the                            patient. Return to normal activities tomorrow.                            Written discharge instructions were provided to the                            patient.                            - Resume previous diet.                           - Continue present medications.                           - Repeat colonoscopy in 10 years for screening                            purposes. Viviann Spare P. Eveny Anastas, MD 09/04/2021 12:23:32 PM This report has been signed electronically.

## 2021-09-04 NOTE — Progress Notes (Signed)
Vs DT ? ?Pt's states no medical or surgical changes since previsit or office visit. ? ?

## 2021-09-04 NOTE — Patient Instructions (Signed)
Impression/Recommendations:  Hemorrhoid handout given to patient.  Resume previous diet. Continue present medications.  Repeat colonoscopy in 10 years for screening purposes.  YOU HAD AN ENDOSCOPIC PROCEDURE TODAY AT THE [facility]:   Refer to the procedure report that was given to you for any specific questions about what was found during the examination.  If the procedure report does not answer your questions, please call your gastroenterologist to clarify.  If you requested that your care partner not be given the details of your procedure findings, then the procedure report has been included in a sealed envelope for you to review at your convenience later.  YOU SHOULD EXPECT: Some feelings of bloating in the abdomen. Passage of more gas than usual.  Walking can help get rid of the air that was put into your GI tract during the procedure and reduce the bloating. If you had a lower endoscopy (such as a colonoscopy or flexible sigmoidoscopy) you may notice spotting of blood in your stool or on the toilet paper. If you underwent a bowel prep for your procedure, you may not have a normal bowel movement for a few days.  Please Note:  You might notice some irritation and congestion in your nose or some drainage.  This is from the oxygen used during your procedure.  There is no need for concern and it should clear up in a day or so.  SYMPTOMS TO REPORT IMMEDIATELY:   Following lower endoscopy (colonoscopy or flexible sigmoidoscopy):  Excessive amounts of blood in the stool  Significant tenderness or worsening of abdominal pains  Swelling of the abdomen that is new, acute  Fever of 100F or higher  For urgent or emergent issues, a gastroenterologist can be reached at any hour by calling [phone]. Do not use MyChart messaging for urgent concerns.    DIET:  We do recommend a small meal at first, but then you may proceed to your regular diet.  Drink plenty of fluids but you  should avoid alcoholic beverages for 24 hours.  ACTIVITY:  You should plan to take it easy for the rest of today and you should NOT DRIVE or use heavy machinery until tomorrow (because of the sedation medicines used during the test).    FOLLOW UP: Our staff will call the number listed on your records 48-72 hours following your procedure to check on you and address any questions or concerns that you may have regarding the information given to you following your procedure. If we do not reach you, we will leave a message.  We will attempt to reach you two times.  During this call, we will ask if you have developed any symptoms of COVID 19. If you develop any symptoms (ie: fever, flu-like symptoms, shortness of breath, cough etc.) before then, please call [phone].  If you test positive for Covid 19 in the 2 weeks post procedure, please call and report this information to us.    If any biopsies were taken you will be contacted by phone or by letter within the next 1-3 weeks.  Please call us at [phone] if you have not heard about the biopsies in 3 weeks.    SIGNATURES/CONFIDENTIALITY: You and/or your care partner have signed paperwork which will be entered into your electronic medical record.  These signatures attest to the fact that that the information above on your After Visit Summary has been reviewed and is understood.  Full responsibility of the confidentiality of this discharge information lies   with you and/or your care-partner. 

## 2021-09-04 NOTE — Progress Notes (Signed)
[facility] History and Physical   Primary Care Physician:  Kristian Covey, MD   Reason for Procedure:   Colon cancer screening  Plan:    colonoscopy     HPI: Gregg Hamilton is a 60 y.o. male  here for colonoscopy screening - last exam 11/2009 was normal. Patient denies any bowel symptoms at this time. No family history of colon cancer known. Otherwise feels well without any cardiopulmonary symptoms.    Past Medical History:  Diagnosis Date   Allergy    Depression    Heart murmur    Hyperlipidemia    Hypertension    Metabolic syndrome    Sleep apnea     Past Surgical History:  Procedure Laterality Date   CARPAL TUNNEL RELEASE  01/2018   Left hand   VASECTOMY      Prior to Admission medications   Medication Sig Start Date End Date Taking? Authorizing Provider  lisinopril-hydrochlorothiazide (ZESTORETIC) 20-12.5 MG tablet TAKE ONE TABLET BY MOUTH DAILY 05/27/21  Yes Burchette, Elberta Fortis, MD  cetirizine (ZYRTEC) 10 MG tablet Take 10 mg by mouth daily.    12/17/11  [provider]  lisinopril (PRINIVIL,ZESTRIL) 10 MG tablet Take 10 mg by mouth daily.    12/17/11  [provider]    Current Outpatient Medications  Medication Sig Dispense Refill   lisinopril-hydrochlorothiazide (ZESTORETIC) 20-12.5 MG tablet TAKE ONE TABLET BY MOUTH DAILY 90 tablet 3   Current Facility-Administered Medications  Medication Dose Route Frequency Provider Last Rate Last Admin   0.9 %  sodium chloride infusion  500 mL Intravenous Once Omelia Marquart, Willaim Rayas, MD        Allergies as of 09/04/2021   (No Known Allergies)    Family History  Problem Relation Age of Onset   Diabetes Mother    Hypertension Mother    Diabetes Father    Hypertension Father    Diabetes Sister    Hypertension Other    Cancer Other        colon   Colon cancer Neg Hx    Esophageal cancer Neg Hx    Stomach cancer Neg Hx    Rectal cancer Neg Hx     Social History   Socioeconomic  History   Marital status: Married    Spouse name: Not on file   Number of children: Not on file   Years of education: Not on file   Highest education level: Not on file  Occupational History   Not on file  Tobacco Use   Smoking status: Never   Smokeless tobacco: Never  Vaping Use   Vaping Use: Never used  Substance and Sexual Activity   Alcohol use: Yes   Drug use: No   Sexual activity: Not on file  Other Topics Concern   Not on file  Social History Narrative   Married no tobacco    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  Physical Exam: Vital signs BP 131/70   Pulse (!) 57   Temp 98.6 F (37 C)   Ht 5\' 10"  (1.778 m)  Wt 226 lb (102.5 kg)   SpO2 96%   BMI 32.43 kg/m   General:   Alert,  Well-developed, pleasant and cooperative in NAD Lungs:  Clear throughout to auscultation.   Heart:  Regular rate and rhythm Abdomen:  Soft, nontender and nondistended.   Neuro/Psych:  Alert and cooperative. Normal mood and affect. A and O x 3  Harlin Rain, MD [facility]

## 2021-09-04 NOTE — Progress Notes (Signed)
Sedate, gd SR, tolerated procedure well, VSS, report to RN 

## 2021-09-08 ENCOUNTER — Telehealth: Payer: Self-pay | Admitting: *Deleted

## 2021-09-08 NOTE — Telephone Encounter (Signed)
First follow up call attempt.  LVM. 

## 2021-09-08 NOTE — Telephone Encounter (Signed)
No answer for post procedure call back. Left VM. 

## 2021-10-28 ENCOUNTER — Ambulatory Visit: Payer: BC Managed Care – PPO | Admitting: Family Medicine

## 2021-10-28 VITALS — BP 120/62 | HR 62 | Temp 98.2°F | Wt 222.8 lb

## 2021-10-28 DIAGNOSIS — L03012 Cellulitis of left finger: Secondary | ICD-10-CM | POA: Diagnosis not present

## 2021-10-28 MED ORDER — TRIAMCINOLONE ACETONIDE 0.5 % EX CREA: 1.0000 "application " | TOPICAL_CREAM | Freq: Two times a day (BID) | CUTANEOUS | 1 refills | Status: DC

## 2021-10-28 NOTE — Progress Notes (Signed)
Established Patient Office Visit  Subjective:  Patient ID: Gregg Hamilton, male    DOB: 02-22-1961  Age: 61 y.o. MRN: [id number]  CC:  Chief Complaint  Patient presents with   Nail Problem    Middle finger on left hand, inflamed and swollen around the nail bed, x 3 weeks    HPI Gregg Hamilton presents for inflammation/swelling left middle finger around the cuticle.  No injury.  No drainage.  Does have some itching.  Minimal discomfort.  Does not keep hands in water very often.  No prior known history of paronychia.  No other skin rashes noted.  No alleviating factors.  Past Medical History:  Diagnosis Date   Allergy    Depression    Heart murmur    Hyperlipidemia    Hypertension    Metabolic syndrome    Sleep apnea     Past Surgical History:  Procedure Laterality Date   CARPAL TUNNEL RELEASE  01/2018   Left hand   VASECTOMY      Family History  Problem Relation Age of Onset   Diabetes Mother    Hypertension Mother    Diabetes Father    Hypertension Father    Diabetes Sister    Hypertension Other    Cancer Other        colon   Colon cancer Neg Hx    Esophageal cancer Neg Hx    Stomach cancer Neg Hx    Rectal cancer Neg Hx     Social History   Socioeconomic History   Marital status: Married    Spouse name: Not on file   Number of children: Not on file   Years of education: Not on file   Highest education level: 12th grade  Occupational History   Not on file  Tobacco Use   Smoking status: Never   Smokeless tobacco: Never  Vaping Use   Vaping Use: Never used  Substance and Sexual Activity   Alcohol use: Yes   Drug use: No   Sexual activity: Not on file  Other Topics Concern   Not on file  Social History Narrative   Married no tobacco    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   Social Determinants of Corporate investment banker Strain: Low Risk    Difficulty of Paying Living Expenses: Not hard at all  Food Insecurity: No Food  Insecurity   Worried About Programme researcher, broadcasting/film/video in the Last Year: Never true   Barista in the Last Year: Never true  Transportation Needs: No Transportation Needs   Lack of Transportation (Medical): No   Lack of Transportation (Non-Medical): No  Physical Activity: Sufficiently Active   Days of Exercise per Week: 7 days   Minutes of Exercise per Session: 40 min  Stress: No Stress Concern Present   Feeling of Stress : Not at all  Social Connections: Unknown   Frequency of Communication with Friends and Family: More than three times a week   Frequency of Social Gatherings with Friends and Family: More than three times a week   Attends Religious Services: Patient refused   Database administrator or Organizations: No   Attends Engineer, structural: Not on file   Marital Status: Married  Catering manager Violence: Not on file    Outpatient Medications Prior to Visit  Medication Sig Dispense Refill   lisinopril-hydrochlorothiazide (ZESTORETIC) 20-12.5 MG tablet TAKE ONE TABLET BY MOUTH DAILY 90  tablet 3   No facility-administered medications prior to visit.    No Known Allergies  ROS Review of Systems  Constitutional:  Negative for chills and fever.     Objective:    Physical Exam Vitals reviewed.  Constitutional:      Appearance: Normal appearance.  Cardiovascular:     Rate and Rhythm: Normal rate and regular rhythm.  Skin:    Comments: Middle finger reveals some swelling and minimal redness around the cuticle region.  No visible pus.  No cellulitis changes.  Nontender.  Neurological:     Mental Status: He is alert.    BP 120/62 (BP Location: Left Arm, Patient Position: Sitting, Cuff Size: Normal)    Pulse 62    Temp 98.2 F (36.8 C) (Oral)    Wt 222 lb 12.8 oz (101.1 kg)    SpO2 98%    BMI 31.97 kg/m  Wt Readings from Last 3 Encounters:  10/28/21 222 lb 12.8 oz (101.1 kg)  09/04/21 226 lb (102.5 kg)  08/24/21 226 lb (102.5 kg)     Health  Maintenance Due  Topic Date Due   COVID-19 Vaccine (1) Never done   HIV Screening  Never done   Zoster Vaccines- Shingrix (1 of 2) Never done    There are no preventive care reminders to display for this patient.  Lab Results  Component Value Date   TSH 2.85 07/01/2021   Lab Results  Component Value Date   WBC 4.5 07/01/2021   HGB 13.8 07/01/2021   HCT 41.2 07/01/2021   MCV 87.3 07/01/2021   PLT 211.0 07/01/2021   Lab Results  Component Value Date   NA 138 07/28/2021   K 4.0 07/28/2021   CO2 31 07/28/2021   GLUCOSE 95 07/28/2021   BUN 15 07/28/2021   CREATININE 0.99 07/28/2021   BILITOT 0.5 07/01/2021   ALKPHOS 48 07/01/2021   AST 19 07/01/2021   ALT 27 07/01/2021   PROT 6.9 07/01/2021   ALBUMIN 4.4 07/01/2021   CALCIUM 9.5 07/28/2021   GFR 82.07 07/28/2021   Lab Results  Component Value Date   CHOL 192 07/01/2021   Lab Results  Component Value Date   HDL 46.40 07/01/2021   Lab Results  Component Value Date   LDLCALC 125 (H) 07/01/2021   Lab Results  Component Value Date   TRIG 104.0 07/01/2021   Lab Results  Component Value Date   CHOLHDL 4 07/01/2021   Lab Results  Component Value Date   HGBA1C 5.9 07/01/2021      Assessment & Plan:   Problem List Items Addressed This Visit   None Visit Diagnoses     Chronic paronychia of finger of left hand    -  Primary     Chronic appearing paronychia of the left middle finger. -Keep hands dry as possible -Recommend trial of triamcinolone 0.5% cream twice daily for the next 2 to 3 weeks. -If not seeing improvement in 2 to 3 weeks consider antifungal  Meds ordered this encounter  Medications   triamcinolone cream (KENALOG) 0.5 %    Sig: Apply 1 application topically 2 (two) times daily.    Dispense:  30 g    Refill:  1    Follow-up: No follow-ups on file.    Evelena Peat, MD

## 2021-10-28 NOTE — Patient Instructions (Signed)
Try to keep hands dry as possible  Use the steroid cream twice daily and let me know in 2-3 weeks if no better.

## 2021-12-22 ENCOUNTER — Encounter: Payer: Self-pay | Admitting: Family Medicine

## 2021-12-22 ENCOUNTER — Ambulatory Visit: Payer: BC Managed Care – PPO | Admitting: Family Medicine

## 2021-12-22 VITALS — BP 122/80 | HR 68 | Temp 97.4°F | Ht 70.0 in | Wt 231.2 lb

## 2021-12-22 DIAGNOSIS — R21 Rash and other nonspecific skin eruption: Secondary | ICD-10-CM

## 2021-12-22 MED ORDER — CICLOPIROX OLAMINE 0.77 % EX CREA: TOPICAL_CREAM | Freq: Two times a day (BID) | CUTANEOUS | 1 refills | Status: DC

## 2021-12-22 NOTE — Progress Notes (Signed)
? ?Established Patient Office Visit ? ?Subjective:  ?Patient ID: Gregg Hamilton, male    DOB: [birth year]  Age: 61 y.o. MRN: [id number] ? ?CC:  ?Chief Complaint  ?Patient presents with  ? Follow-up  ? ? ?HPI ?Gregg Hamilton presents for follow-up regarding right middle finger rash.  He presented with what looks like chronic paronychia of the right middle finger February 1.  We started topical steroid cream with triamcinolone 0.1% cream but has not seen any improvement.  In fact this seems to be spreading.  He is trying to keep his hands out of water.  Denies any history of chronic paronychia previously.  He noted a little bit of itching of the adjacent finger and wonders that this may be spreading.  No interdigital webspace involvement. ? ?Past Medical History:  ?Diagnosis Date  ? Allergy   ? Depression   ? Heart murmur   ? Hyperlipidemia   ? Hypertension   ? Metabolic syndrome   ? Sleep apnea   ? ? ?Past Surgical History:  ?Procedure Laterality Date  ? CARPAL TUNNEL RELEASE  01/2018  ? Left hand  ? VASECTOMY    ? ? ?Family History  ?Problem Relation Age of Onset  ? Diabetes Mother   ? Hypertension Mother   ? Diabetes Father   ? Hypertension Father   ? Diabetes Sister   ? Hypertension Other   ? Cancer Other   ?     colon  ? Colon cancer Neg Hx   ? Esophageal cancer Neg Hx   ? Stomach cancer Neg Hx   ? Rectal cancer Neg Hx   ? ? ?Social History  ? ?Socioeconomic History  ? Marital status: Married  ?  Spouse name: Not on file  ? Number of children: Not on file  ? Years of education: Not on file  ? Highest education level: 12th grade  ?Occupational History  ? Not on file  ?Tobacco Use  ? Smoking status: Never  ? Smokeless tobacco: Never  ?Vaping Use  ? Vaping Use: Never used  ?Substance and Sexual Activity  ? Alcohol use: Yes  ? Drug use: No  ? Sexual activity: Not on file  ?Other Topics Concern  ? Not on file  ?Social History Narrative  ? Married no tobacco   ? 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 ?Social  Determinants of Health  ? ?Financial Resource Strain: Low Risk   ? Difficulty of Paying Living Expenses: Not hard at all  ?Food Insecurity: No Food Insecurity  ? Worried About Programme researcher, broadcasting/film/videounning Out of Food in the Last Year: Never true  ? Ran Out of Food in the Last Year: Never true  ?Transportation Needs: No Transportation Needs  ? Lack of Transportation (Medical): No  ? Lack of Transportation (Non-Medical): No  ?Physical Activity: Sufficiently Active  ? Days of Exercise per Week: 7 days  ? Minutes of Exercise per Session: 40 min  ?Stress: No Stress Concern Present  ? Feeling of Stress : Not at all  ?Social Connections: Unknown  ? Frequency of Communication with Friends and Family: More than three times a week  ? Frequency of Social Gatherings with Friends and Family: More than three times a week  ? Attends Religious Services: Patient refused  ? Active Member of Clubs or Organizations: No  ? Attends BankerClub or Organization Meetings: Not on file  ? Marital Status: Married  ?Intimate Partner Violence: Not on file  ? ? ?Outpatient  Medications Prior to Visit  ?Medication Sig Dispense Refill  ? lisinopril-hydrochlorothiazide (ZESTORETIC) 20-12.5 MG tablet TAKE ONE TABLET BY MOUTH DAILY 90 tablet 3  ? triamcinolone cream (KENALOG) 0.5 % Apply 1 application topically 2 (two) times daily. 30 g 1  ? ?No facility-administered medications prior to visit.  ? ? ?No Known Allergies ? ?ROS ?Review of Systems  ?Constitutional:  Negative for chills and fever.  ?Skin:  Positive for rash.  ? ?  ?Objective:  ?  ?Physical Exam ?Vitals reviewed.  ?Constitutional:   ?   Appearance: Normal appearance.  ?Cardiovascular:  ?   Rate and Rhythm: Normal rate and regular rhythm.  ?Skin: ?   Findings: Rash present.  ?   Comments: Right middle finger rash.  This is mostly dorsally and extends from the base of the nail to just proximal to the DIP joint.  Some scaly rash.  No pustules.  No vesicles.  No cellulitis changes.  ?Neurological:  ?   Mental Status: He  is alert.  ? ? ?BP 122/80 (BP Location: Left Arm, Patient Position: Sitting, Cuff Size: Normal)   Pulse 68   Temp (!) 97.4 ?F (36.3 ?C) (Oral)   Ht 5\' 10"  (1.778 m)   Wt 231 lb 3.2 oz (104.9 kg)   SpO2 95%   BMI 33.17 kg/m?  ?Wt Readings from Last 3 Encounters:  ?12/22/21 231 lb 3.2 oz (104.9 kg)  ?10/28/21 222 lb 12.8 oz (101.1 kg)  ?09/04/21 226 lb (102.5 kg)  ? ? ? ?Health Maintenance Due  ?Topic Date Due  ? COVID-19 Vaccine (1) Never done  ? HIV Screening  Never done  ? Zoster Vaccines- Shingrix (1 of 2) Never done  ? ? ?There are no preventive care reminders to display for this patient. ? ?Lab Results  ?Component Value Date  ? TSH 2.85 07/01/2021  ? ?Lab Results  ?Component Value Date  ? WBC 4.5 07/01/2021  ? HGB 13.8 07/01/2021  ? HCT 41.2 07/01/2021  ? MCV 87.3 07/01/2021  ? PLT 211.0 07/01/2021  ? ?Lab Results  ?Component Value Date  ? NA 138 07/28/2021  ? K 4.0 07/28/2021  ? CO2 31 07/28/2021  ? GLUCOSE 95 07/28/2021  ? BUN 15 07/28/2021  ? CREATININE 0.99 07/28/2021  ? BILITOT 0.5 07/01/2021  ? ALKPHOS 48 07/01/2021  ? AST 19 07/01/2021  ? ALT 27 07/01/2021  ? PROT 6.9 07/01/2021  ? ALBUMIN 4.4 07/01/2021  ? CALCIUM 9.5 07/28/2021  ? GFR 82.07 07/28/2021  ? ?Lab Results  ?Component Value Date  ? CHOL 192 07/01/2021  ? ?Lab Results  ?Component Value Date  ? HDL 46.40 07/01/2021  ? ?Lab Results  ?Component Value Date  ? LDLCALC 125 (H) 07/01/2021  ? ?Lab Results  ?Component Value Date  ? TRIG 104.0 07/01/2021  ? ?Lab Results  ?Component Value Date  ? CHOLHDL 4 07/01/2021  ? ?Lab Results  ?Component Value Date  ? HGBA1C 5.9 07/01/2021  ? ? ?  ?Assessment & Plan:  ? ?Skin rash right middle finger.  Started with appearance of chronic paronychia but not responding to topical steroids. ? ?-We obtained scraping with #15 blade onto glass slide to submit for fungal culture and smear ?-Consider trial of Loprox antifungal cream to use twice daily pending results above. ?-Continue to keep hands as dry as  possible ? ? ?Meds ordered this encounter  ?Medications  ? ciclopirox (LOPROX) 0.77 % cream  ?  Sig: Apply topically 2 (two) times daily.  ?  Dispense:  15 g  ?  Refill:  1  ? ? ?Follow-up: No follow-ups on file.  ? ? ?Evelena Peat, MD ?

## 2021-12-22 NOTE — Patient Instructions (Signed)
Leave off the Triamcinolone. ? ?Start the topical anti-fungal cream ? ?We will call with the fungal culture and smear/KOH.   ?

## 2022-01-21 LAB — CULT, FUNGUS, SKIN,HAIR,NAIL W/KOH
CULTURE:: NO GROWTH
MICRO NUMBER:: 13201106
SMEAR:: NONE SEEN
SPECIMEN QUALITY:: ADEQUATE

## 2022-01-25 MED ORDER — CLOBETASOL PROPIONATE 0.05 % EX CREA: TOPICAL_CREAM | CUTANEOUS | 0 refills | Status: AC

## 2022-01-25 NOTE — Addendum Note (Signed)
Addended by: Christy Sartorius on: 01/25/2022 10:13 AM ? ? Modules accepted: Orders ? ?

## 2022-02-16 ENCOUNTER — Ambulatory Visit: Payer: BC Managed Care – PPO | Admitting: Family Medicine

## 2022-02-16 ENCOUNTER — Encounter: Payer: Self-pay | Admitting: Family Medicine

## 2022-02-16 VITALS — BP 124/70 | HR 65 | Temp 97.6°F | Ht 70.0 in | Wt 233.5 lb

## 2022-02-16 DIAGNOSIS — L301 Dyshidrosis [pompholyx]: Secondary | ICD-10-CM | POA: Diagnosis not present

## 2022-02-16 MED ORDER — PREDNISONE 20 MG PO TABS: ORAL_TABLET | ORAL | 0 refills | Status: DC

## 2022-02-16 NOTE — Progress Notes (Signed)
   Established Patient Office Visit  Subjective   Patient ID: Gregg Hamilton, male    DOB: 04-26-1961  Age: 61 y.o. MRN: [id number]  Chief Complaint  Patient presents with   Follow-up   Wound Check    Patient complains of wound on foot, x3 weeks, Patient denies any known injury    HPI   Continues to battle rash involving now of several digits of the hands as well as left foot.  Initially he presented with what looked like chronic paronychia.  He was placed on steroid cream without improvement.  He then had some extension of rash more proximally and we obtained scraping for fungal culture and smear which came back negative.  He had been tried briefly on antifungal cream without improvement.  He recently tried clobetasol cream and feels like this did not help and if anything may have made worse.  He has now has some involvement left foot which looks somewhat similar to the hand rash.  This comes up as superficial vesicles that rupture and then crust over with drying.  He has seen dermatologist in the past but not for this. He does not have his hands in any chemicals.  Generally keeps his hands out of water. He has a granddaughter with very similar rash.  Past Medical History:  Diagnosis Date   Allergy    Depression    Heart murmur    Hyperlipidemia    Hypertension    Metabolic syndrome    Sleep apnea    Past Surgical History:  Procedure Laterality Date   CARPAL TUNNEL RELEASE  01/2018   Left hand   VASECTOMY      reports that he has never smoked. He has never used smokeless tobacco. He reports current alcohol use. He reports that he does not use drugs. family history includes Cancer in an other family member; Diabetes in his father, mother, and sister; Hypertension in his father, mother, and another family member. No Known Allergies  Review of Systems  Constitutional:  Negative for chills and fever.  Skin:  Positive for rash.     Objective:     BP 124/70 (BP Location:  Left Arm, Patient Position: Sitting, Cuff Size: Normal)   Pulse 65   Temp 97.6 F (36.4 C) (Oral)   Ht 5\' 10"  (1.778 m)   Wt 233 lb 8 oz (105.9 kg)   SpO2 98%   BMI 33.50 kg/m    Physical Exam Vitals reviewed.  Skin:    Findings: Rash present.     Comments: He has rash involving most digits.  He has several areas of dry scaling.  He has a couple small scattered vesicles.  No pustules.  Very similar small patch of rash lateral aspect left foot  Neurological:     Mental Status: He is alert.     No results found for any visits on 02/16/22.    The 10-year ASCVD risk score (Arnett DK, et al., 2019) is: 11.3%    Assessment & Plan:   Progressive skin rash involving the hands and now left foot.  Suspect dyshidrotic eczema.  He has been on potent steroid cream without much improvement.  -Recommend he follow-up with his dermatologist.  May need more aggressive therapy if not responding to topicals. -We agreed to brief taper of prednisone but he knows this is short-term only.  No follow-ups on file.    2020, MD

## 2022-02-23 DIAGNOSIS — L309 Dermatitis, unspecified: Secondary | ICD-10-CM | POA: Diagnosis not present

## 2022-04-12 DIAGNOSIS — L821 Other seborrheic keratosis: Secondary | ICD-10-CM | POA: Diagnosis not present

## 2022-04-12 DIAGNOSIS — L820 Inflamed seborrheic keratosis: Secondary | ICD-10-CM | POA: Diagnosis not present

## 2022-04-12 DIAGNOSIS — L578 Other skin changes due to chronic exposure to nonionizing radiation: Secondary | ICD-10-CM | POA: Diagnosis not present

## 2022-04-12 DIAGNOSIS — D2272 Melanocytic nevi of left lower limb, including hip: Secondary | ICD-10-CM | POA: Diagnosis not present

## 2022-04-12 DIAGNOSIS — L814 Other melanin hyperpigmentation: Secondary | ICD-10-CM | POA: Diagnosis not present

## 2022-06-05 ENCOUNTER — Other Ambulatory Visit: Payer: Self-pay | Admitting: Family Medicine

## 2022-06-05 DIAGNOSIS — Z0000 Encounter for general adult medical examination without abnormal findings: Secondary | ICD-10-CM

## 2022-07-05 ENCOUNTER — Encounter: Payer: Self-pay | Admitting: Family Medicine

## 2022-07-05 ENCOUNTER — Ambulatory Visit (INDEPENDENT_AMBULATORY_CARE_PROVIDER_SITE_OTHER): Payer: BC Managed Care – PPO | Admitting: Family Medicine

## 2022-07-05 VITALS — BP 120/70 | HR 75 | Temp 98.0°F | Ht 69.69 in | Wt 255.9 lb

## 2022-07-05 DIAGNOSIS — Z125 Encounter for screening for malignant neoplasm of prostate: Secondary | ICD-10-CM | POA: Diagnosis not present

## 2022-07-05 DIAGNOSIS — Z0000 Encounter for general adult medical examination without abnormal findings: Secondary | ICD-10-CM

## 2022-07-05 DIAGNOSIS — R7303 Prediabetes: Secondary | ICD-10-CM | POA: Insufficient documentation

## 2022-07-05 LAB — LIPID PANEL
Cholesterol: 335 mg/dL — ABNORMAL HIGH (ref 0–200)
HDL: 48.5 mg/dL (ref >39.00)
Total CHOL/HDL Ratio: 7
Triglycerides: 473 mg/dL — ABNORMAL HIGH (ref 0.0–149.0)

## 2022-07-05 LAB — CBC WITH DIFFERENTIAL/PLATELET
Basophils Absolute: 0 10*3/uL (ref 0.0–0.1)
Basophils Relative: 0.6 % (ref 0.0–3.0)
Eosinophils Absolute: 0.2 10*3/uL (ref 0.0–0.7)
Eosinophils Relative: 3 % (ref 0.0–5.0)
HCT: 43.2 % (ref 39.0–52.0)
Hemoglobin: 14.5 g/dL (ref 13.0–17.0)
Lymphocytes Relative: 34.3 % (ref 12.0–46.0)
Lymphs Abs: 1.9 10*3/uL (ref 0.7–4.0)
MCHC: 33.6 g/dL (ref 30.0–36.0)
MCV: 88.4 fl (ref 78.0–100.0)
Monocytes Absolute: 0.5 10*3/uL (ref 0.1–1.0)
Monocytes Relative: 8.2 % (ref 3.0–12.0)
Neutro Abs: 3 10*3/uL (ref 1.4–7.7)
Neutrophils Relative %: 53.9 % (ref 43.0–77.0)
Platelets: 206 10*3/uL (ref 150.0–400.0)
RBC: 4.89 Mil/uL (ref 4.22–5.81)
RDW: 13.4 % (ref 11.5–15.5)
WBC: 5.6 10*3/uL (ref 4.0–10.5)

## 2022-07-05 LAB — PSA: PSA: 0.96 ng/mL (ref 0.10–4.00)

## 2022-07-05 LAB — BASIC METABOLIC PANEL
BUN: 17 mg/dL (ref 6–23)
CO2: 29 mEq/L (ref 19–32)
Calcium: 9.5 mg/dL (ref 8.4–10.5)
Chloride: 100 mEq/L (ref 96–112)
Creatinine, Ser: 1 mg/dL (ref 0.40–1.50)
GFR: 80.56 mL/min (ref >60.00)
Glucose, Bld: 94 mg/dL (ref 70–99)
Potassium: 4.4 mEq/L (ref 3.5–5.1)
Sodium: 136 mEq/L (ref 135–145)

## 2022-07-05 LAB — HEPATIC FUNCTION PANEL
ALT: 25 U/L (ref 0–53)
AST: 18 U/L (ref 0–37)
Albumin: 4.4 g/dL (ref 3.5–5.2)
Alkaline Phosphatase: 63 U/L (ref 39–117)
Bilirubin, Direct: 0 mg/dL (ref 0.0–0.3)
Total Bilirubin: 0.4 mg/dL (ref 0.2–1.2)
Total Protein: 7.2 g/dL (ref 6.0–8.3)

## 2022-07-05 LAB — LDL CHOLESTEROL, DIRECT: Direct LDL: 178 mg/dL

## 2022-07-05 LAB — TSH: TSH: 2.84 u[IU]/mL (ref 0.35–5.50)

## 2022-07-05 LAB — HEMOGLOBIN A1C: Hgb A1c MFr Bld: 5.9 % (ref 4.6–6.5)

## 2022-07-05 MED ORDER — LISINOPRIL-HYDROCHLOROTHIAZIDE 20-12.5 MG PO TABS: 1.0000 | ORAL_TABLET | Freq: Every day | ORAL | 3 refills | Status: DC

## 2022-07-05 NOTE — Patient Instructions (Signed)
Consider Shingrix vaccine at some point this year.  

## 2022-07-05 NOTE — Progress Notes (Addendum)
Established Patient Office Visit  Subjective   Patient ID: Gregg Hamilton, male    DOB: [birth year]  Age: 61 y.o. MRN: [id number]  Chief Complaint  Patient presents with   Annual Exam    HPI   Gregg Hamilton is seen for physical exam.  He brings in a form to be completed for his work.  He has history of hypertension which is currently controlled with lisinopril HCTZ.  Very strong family history of type 2 diabetes in both parents.  He also has history of prediabetes by previous blood sugars.  His other medical problems include history of obstructive sleep apnea, metabolic syndrome, hyperlipidemia.  Health maintenance reviewed  -He declines flu vaccine -No history of Shingrix and he declines today -Tetanus up-to-date -Just had colonoscopy 12/22  Family history reviewed-mother had history of hypertension and diabetes and passed away from complications of diabetes.  Father lived until age 36.  He has a sister with type 2 diabetes.  Social history-he is married has 2 children and 2 grandchildren.  He continues to work for [employer]	[place].  He is a non-smoker.  Occasional beer use.  The ASCVD Risk score (Arnett DK, et al., 2019) failed to calculate for the following reasons:   The valid total cholesterol range is 130 to 320 mg/dL   Past Medical History:  Diagnosis Date   Allergy    Depression    Heart murmur    Hyperlipidemia    Hypertension    Metabolic syndrome    Sleep apnea    Past Surgical History:  Procedure Laterality Date   CARPAL TUNNEL RELEASE  01/2018   Left hand   VASECTOMY      reports that he has never smoked. He has never used smokeless tobacco. He reports current alcohol use. He reports that he does not use drugs. family history includes Cancer in an other family member; Diabetes in his father, mother, and sister; Hypertension in his father, mother, and another family member. No Known Allergies   Review of Systems  Constitutional:  Negative for  malaise/fatigue.  Eyes:  Negative for blurred vision.  Respiratory:  Negative for shortness of breath.   Cardiovascular:  Negative for chest pain.  Gastrointestinal:  Negative for abdominal pain.  Neurological:  Negative for dizziness, weakness and headaches.      Objective:     BP 120/70 (BP Location: Left Arm, Patient Position: Sitting, Cuff Size: Large)   Pulse 75   Temp 98 F (36.7 C) (Oral)   Ht 5' 9.69" (1.77 m)   Wt 255 lb 14.4 oz (116.1 kg)   SpO2 98%   BMI 37.05 kg/m    Physical Exam Vitals reviewed.  Constitutional:      General: He is not in acute distress.    Appearance: He is well-developed.  HENT:     Head: Normocephalic and atraumatic.     Right Ear: External ear normal.     Left Ear: External ear normal.  Eyes:     Conjunctiva/sclera: Conjunctivae normal.     Pupils: Pupils are equal, round, and reactive to light.  Neck:     Thyroid: No thyromegaly.  Cardiovascular:     Rate and Rhythm: Normal rate and regular rhythm.     Heart sounds: Normal heart sounds. No murmur heard. Pulmonary:     Effort: No respiratory distress.     Breath sounds: No wheezing or rales.  Abdominal:     General: Bowel sounds are normal. There is no distension.  Palpations: Abdomen is soft. There is no mass.     Tenderness: There is no abdominal tenderness. There is no guarding or rebound.  Musculoskeletal:     Cervical back: Normal range of motion and neck supple.  Lymphadenopathy:     Cervical: No cervical adenopathy.  Skin:    Findings: No rash.  Neurological:     Mental Status: He is alert and oriented to person, place, and time.     Cranial Nerves: No cranial nerve deficit.      No results found for any visits on 07/05/22.    The 10-year ASCVD risk score (Arnett DK, et al., 2019) is: 10.7%    Assessment & Plan:   Problem List Items Addressed This Visit   None Visit Diagnoses     Physical exam       Relevant Medications    lisinopril-hydrochlorothiazide (ZESTORETIC) 20-12.5 MG tablet   Other Relevant Orders   Basic metabolic panel   Lipid panel   CBC with Differential/Platelet   TSH   Hepatic function panel   PSA   Hemoglobin A1c     -Flu vaccine offered and declined -Discussed Shingrix vaccine and he declines at this time. -Discussed diabetes prevention with recommendations for weight loss, regular aerobic exercise, and reduction in sugars and starches.  Obtain screening labs including A1c with strong family history of diabetes as well as his personal history of prediabetes.  No follow-ups on file.    Carolann Littler, MD

## 2022-07-09 ENCOUNTER — Other Ambulatory Visit: Payer: Self-pay

## 2022-07-09 DIAGNOSIS — E785 Hyperlipidemia, unspecified: Secondary | ICD-10-CM

## 2023-01-20 ENCOUNTER — Ambulatory Visit: Payer: BC Managed Care – PPO | Admitting: Family Medicine

## 2023-01-20 VITALS — BP 142/84 | HR 78 | Temp 98.4°F | Wt 262.4 lb

## 2023-01-20 DIAGNOSIS — M79645 Pain in left finger(s): Secondary | ICD-10-CM

## 2023-01-20 DIAGNOSIS — M65342 Trigger finger, left ring finger: Secondary | ICD-10-CM

## 2023-01-20 DIAGNOSIS — M65331 Trigger finger, right middle finger: Secondary | ICD-10-CM

## 2023-01-20 NOTE — Progress Notes (Signed)
   Established Patient Office Visit   Subjective  Patient ID: Gregg Hamilton, male    DOB: 05-05-1961  Age: 62 y.o. MRN: [id number]  Chief Complaint  Patient presents with   Hand Pain    Left ring finger and right middle finger, started about 1-2 months ago. Left finger constant, especially when clicks in place. Right finger not constant pain but it is there. Only massaging for pain. Does not recal anything happening before pain started     Pt is a 62 yo male with pmh sig for pre DM, HTN.  OSA, HLD followed by Dr. Caryl Never and seen for ongoing issue.  Patient endorses left fourth digit and right third digit becoming stuck with flexion times months.  Patient having pain in left fourth digit, having to forcibly straighten finger with the other hand.  Patient is right-handed.  Patient inquires about cortisone injectionS.  Hand Pain  The incident occurred more than 1 week ago. There was no injury mechanism. The pain does not radiate.      ROS Negative unless stated above    Objective:     BP (!) 142/84 (BP Location: Right Arm, Patient Position: Sitting, Cuff Size: Normal)   Pulse 78   Temp 98.4 F (36.9 C) (Oral)   Wt 262 lb 6.4 oz (119 kg)   SpO2 93%   BMI 37.99 kg/m    Physical Exam Constitutional:      General: He is not in acute distress.    Appearance: Normal appearance.  HENT:     Head: Normocephalic and atraumatic.     Nose: Nose normal.     Mouth/Throat:     Mouth: Mucous membranes are moist.  Cardiovascular:     Rate and Rhythm: Normal rate and regular rhythm.     Heart sounds: Normal heart sounds. No murmur heard.    No gallop.  Pulmonary:     Effort: Pulmonary effort is normal. No respiratory distress.     Breath sounds: Normal breath sounds. No wheezing, rhonchi or rales.  Musculoskeletal:     Comments: Triggering of the left fourth digit and right third digit.  Skin:    General: Skin is warm and dry.  Neurological:     Mental Status: He is alert and  oriented to person, place, and time.      No results found for any visits on 01/20/23.    Assessment & Plan:  Trigger ring finger of left hand -     Ambulatory referral to Hand Surgery  Trigger middle finger of right hand -     Ambulatory referral to Hand Surgery  Pain of finger of left hand -     Ambulatory referral to Hand Surgery   Referral placed to hand surgery.  Discussed treatment options including steroid injection or surgery for continued or worsening symptoms.  Return if symptoms worsen or fail to improve.   Deeann Saint, MD

## 2023-01-21 DIAGNOSIS — M65342 Trigger finger, left ring finger: Secondary | ICD-10-CM | POA: Diagnosis not present

## 2023-01-21 DIAGNOSIS — M65331 Trigger finger, right middle finger: Secondary | ICD-10-CM | POA: Diagnosis not present

## 2023-01-21 DIAGNOSIS — M79642 Pain in left hand: Secondary | ICD-10-CM | POA: Diagnosis not present

## 2023-01-21 DIAGNOSIS — M79641 Pain in right hand: Secondary | ICD-10-CM | POA: Diagnosis not present

## 2023-02-14 DIAGNOSIS — M65342 Trigger finger, left ring finger: Secondary | ICD-10-CM | POA: Diagnosis not present

## 2023-02-14 DIAGNOSIS — M65331 Trigger finger, right middle finger: Secondary | ICD-10-CM | POA: Diagnosis not present

## 2023-07-13 ENCOUNTER — Encounter: Payer: Self-pay | Admitting: Family Medicine

## 2023-07-13 ENCOUNTER — Ambulatory Visit: Payer: Commercial Managed Care - PPO | Admitting: Family Medicine

## 2023-07-13 VITALS — BP 128/82 | HR 75 | Temp 98.0°F | Ht 69.5 in | Wt 252.0 lb

## 2023-07-13 DIAGNOSIS — Z0000 Encounter for general adult medical examination without abnormal findings: Secondary | ICD-10-CM

## 2023-07-13 DIAGNOSIS — E785 Hyperlipidemia, unspecified: Secondary | ICD-10-CM

## 2023-07-13 NOTE — Progress Notes (Signed)
Established Patient Office Visit  Subjective   Patient ID: Gregg Hamilton, male    DOB: 1961-08-08  Age: 62 y.o. MRN: [id number]  No chief complaint on file.   HPI   Gregg Hamilton is seen today for physical exam.  He has history of hypertension currently treated with lisinopril HCTZ.  Home blood pressures have generally been stable.  He checks blood sugars periodically usually gets range of 96-99.  History of very high cholesterol with LDL 178 a year ago.  Declined statins.  Is willing to consider possible coronary calcium score.  Health maintenance reviewed:  Health Maintenance  Topic Date Due   HIV Screening  Never done   COVID-19 Vaccine (1 - 2023-24 season) Never done   Zoster Vaccines- Shingrix (1 of 2) 10/13/2023 (Originally 10/25/2009)   INFLUENZA VACCINE  12/26/2023 (Originally 04/28/2023)   DTaP/Tdap/Td (3 - Td or Tdap) 03/01/2028   Colonoscopy  09/05/2031   Hepatitis C Screening  Completed   HPV VACCINES  Aged Out   Fecal DNA (Cologuard)  Discontinued   -Declines flu vaccine.  Family history reviewed-mother had history of hypertension and diabetes and passed away from complications of diabetes around age 63.  Father lived until age 33.  He also reportedly had type 2 diabetes..  He has a sister with type 2 diabetes.   Social history-he is married has 2 children and 2 grandchildren.  He continues to work for [employer]	[place].  He is a non-smoker.  Occasional beer use.  Plans to work till about age 56.  Past Medical History:  Diagnosis Date   Allergy    Depression    Heart murmur    Hyperlipidemia    Hypertension    Metabolic syndrome    Sleep apnea    Past Surgical History:  Procedure Laterality Date   CARPAL TUNNEL RELEASE  01/2018   Left hand   VASECTOMY      reports that he has never smoked. He has never used smokeless tobacco. He reports current alcohol use. He reports that he does not use drugs. family history includes Cancer in an other family member;  Diabetes in his father, mother, and sister; Hypertension in his father, mother, and another family member. No Known Allergies  Review of Systems  Constitutional:  Negative for chills, fever, malaise/fatigue and weight loss.  HENT:  Negative for hearing loss.   Eyes:  Negative for blurred vision and double vision.  Respiratory:  Negative for cough and shortness of breath.   Cardiovascular:  Negative for chest pain, palpitations and leg swelling.  Gastrointestinal:  Negative for abdominal pain, blood in stool, constipation and diarrhea.  Genitourinary:  Negative for dysuria.  Skin:  Negative for rash.  Neurological:  Negative for dizziness, speech change, seizures, loss of consciousness and headaches.  Psychiatric/Behavioral:  Negative for depression.       Objective:     BP 128/82 (BP Location: Left Arm, Cuff Size: Normal)   Pulse 75   Temp 98 F (36.7 C) (Oral)   Ht 5' 9.5" (1.765 m)   Wt 252 lb (114.3 kg)   SpO2 98%   BMI 36.68 kg/m  BP Readings from Last 3 Encounters:  07/13/23 128/82  01/20/23 (!) 142/84  07/05/22 120/70   Wt Readings from Last 3 Encounters:  07/13/23 252 lb (114.3 kg)  01/20/23 262 lb 6.4 oz (119 kg)  07/05/22 255 lb 14.4 oz (116.1 kg)      Physical Exam Vitals reviewed.  Constitutional:  General: He is not in acute distress.    Appearance: He is well-developed.  HENT:     Head: Normocephalic and atraumatic.     Right Ear: Tympanic membrane and external ear normal.     Left Ear: Tympanic membrane and external ear normal.  Eyes:     Conjunctiva/sclera: Conjunctivae normal.     Pupils: Pupils are equal, round, and reactive to light.  Neck:     Thyroid: No thyromegaly.  Cardiovascular:     Rate and Rhythm: Normal rate and regular rhythm.     Heart sounds: Normal heart sounds. No murmur heard. Pulmonary:     Effort: No respiratory distress.     Breath sounds: No wheezing or rales.  Abdominal:     General: Bowel sounds are normal.  There is no distension.     Palpations: Abdomen is soft. There is no mass.     Tenderness: There is no abdominal tenderness. There is no guarding or rebound.  Musculoskeletal:     Cervical back: Normal range of motion and neck supple.     Right lower leg: No edema.     Left lower leg: No edema.  Lymphadenopathy:     Cervical: No cervical adenopathy.  Skin:    Findings: No rash.  Neurological:     Mental Status: He is alert and oriented to person, place, and time.     Cranial Nerves: No cranial nerve deficit.      No results found for any visits on 07/13/23.  Last CBC Lab Results  Component Value Date   WBC 5.6 07/05/2022   HGB 14.5 07/05/2022   HCT 43.2 07/05/2022   MCV 88.4 07/05/2022   RDW 13.4 07/05/2022   PLT 206.0 07/05/2022   Last metabolic panel Lab Results  Component Value Date   GLUCOSE 94 07/05/2022   NA 136 07/05/2022   K 4.4 07/05/2022   CL 100 07/05/2022   CO2 29 07/05/2022   BUN 17 07/05/2022   CREATININE 1.00 07/05/2022   GFR 80.56 07/05/2022   CALCIUM 9.5 07/05/2022   PROT 7.2 07/05/2022   ALBUMIN 4.4 07/05/2022   BILITOT 0.4 07/05/2022   ALKPHOS 63 07/05/2022   AST 18 07/05/2022   ALT 25 07/05/2022   Last lipids Lab Results  Component Value Date   CHOL 335 (H) 07/05/2022   HDL 48.50 07/05/2022   LDLCALC 125 (H) 07/01/2021   LDLDIRECT 178.0 07/05/2022   TRIG (H) 07/05/2022    473.0 Triglyceride is over 400; calculations on Lipids are invalid.   CHOLHDL 7 07/05/2022   Last hemoglobin A1c Lab Results  Component Value Date   HGBA1C 5.9 07/05/2022      The ASCVD Risk score (Arnett DK, et al., 2019) failed to calculate for the following reasons:   The valid total cholesterol range is 130 to 320 mg/dL    Assessment & Plan:   Problem List Items Addressed This Visit       Unprioritized   Hyperlipidemia   Relevant Orders   CT CARDIAC SCORING (SELF PAY ONLY)   Other Visit Diagnoses     Physical exam    -  Primary   Relevant  Orders   Basic metabolic panel   Lipid panel   CBC with Differential/Platelet   Hepatic function panel   PSA   Hemoglobin A70c     62 year old male with hypertension which is reasonably well-controlled.  He has history of fairly severe hyperlipidemia and has declined statins in the past.  Strong family history of type 2 diabetes as above.  We discussed several issues as follows  -Check lab work as above.  Include A1c with past history of A1c of 5.9% and strong family history of type 2 diabetes mother, father, and sister -Recommend consider coronary calcium score for further restratification and he agrees.  This was ordered -Offered flu vaccine but he declines -Recommend low saturated fat diet and again consider statin use if indicated by labs above.  No follow-ups on file.    Evelena Peat, MD

## 2023-07-14 LAB — CBC WITH DIFFERENTIAL/PLATELET
Basophils Absolute: 0 10*3/uL (ref 0.0–0.1)
Basophils Relative: 0.7 % (ref 0.0–3.0)
Eosinophils Absolute: 0.2 10*3/uL (ref 0.0–0.7)
Eosinophils Relative: 2.4 % (ref 0.0–5.0)
HCT: 43.9 % (ref 39.0–52.0)
Hemoglobin: 14.4 g/dL (ref 13.0–17.0)
Lymphocytes Relative: 29.7 % (ref 12.0–46.0)
Lymphs Abs: 2.2 10*3/uL (ref 0.7–4.0)
MCHC: 32.9 g/dL (ref 30.0–36.0)
MCV: 88.3 fL (ref 78.0–100.0)
Monocytes Absolute: 0.5 10*3/uL (ref 0.1–1.0)
Monocytes Relative: 6.1 % (ref 3.0–12.0)
Neutro Abs: 4.5 10*3/uL (ref 1.4–7.7)
Neutrophils Relative %: 61.1 % (ref 43.0–77.0)
Platelets: 243 10*3/uL (ref 150.0–400.0)
RBC: 4.97 Mil/uL (ref 4.22–5.81)
RDW: 14.1 % (ref 11.5–15.5)
WBC: 7.4 10*3/uL (ref 4.0–10.5)

## 2023-07-14 LAB — BASIC METABOLIC PANEL
BUN: 19 mg/dL (ref 6–23)
CO2: 31 meq/L (ref 19–32)
Calcium: 9.9 mg/dL (ref 8.4–10.5)
Chloride: 97 meq/L (ref 96–112)
Creatinine, Ser: 1.01 mg/dL (ref 0.40–1.50)
GFR: 79.03 mL/min (ref >60.00)
Glucose, Bld: 91 mg/dL (ref 70–99)
Potassium: 4.5 meq/L (ref 3.5–5.1)
Sodium: 137 meq/L (ref 135–145)

## 2023-07-14 LAB — HEPATIC FUNCTION PANEL
ALT: 25 U/L (ref 0–53)
AST: 16 U/L (ref 0–37)
Albumin: 4.5 g/dL (ref 3.5–5.2)
Alkaline Phosphatase: 79 U/L (ref 39–117)
Bilirubin, Direct: 0.1 mg/dL (ref 0.0–0.3)
Total Bilirubin: 0.5 mg/dL (ref 0.2–1.2)
Total Protein: 7.4 g/dL (ref 6.0–8.3)

## 2023-07-14 LAB — LIPID PANEL
Cholesterol: 289 mg/dL — ABNORMAL HIGH (ref 0–200)
HDL: 53.9 mg/dL (ref >39.00)
LDL Cholesterol: 203 mg/dL — ABNORMAL HIGH (ref 0–99)
NonHDL: 235.26
Total CHOL/HDL Ratio: 5
Triglycerides: 160 mg/dL — ABNORMAL HIGH (ref 0.0–149.0)
VLDL: 32 mg/dL (ref 0.0–40.0)

## 2023-07-14 LAB — PSA: PSA: 1.63 ng/mL (ref 0.10–4.00)

## 2023-07-14 LAB — HEMOGLOBIN A1C: Hgb A1c MFr Bld: 5.8 % (ref 4.6–6.5)

## 2023-07-15 NOTE — Progress Notes (Signed)
Noted.  That sounds reasonable.  Kristian Covey MD [facility]

## 2023-07-27 ENCOUNTER — Telehealth: Payer: Self-pay | Admitting: Family Medicine

## 2023-07-27 ENCOUNTER — Encounter: Payer: Self-pay | Admitting: Family Medicine

## 2023-07-27 ENCOUNTER — Ambulatory Visit: Payer: Commercial Managed Care - PPO | Admitting: Family Medicine

## 2023-07-27 VITALS — BP 138/82 | HR 89 | Temp 98.8°F | Ht 69.5 in | Wt 258.2 lb

## 2023-07-27 DIAGNOSIS — M109 Gout, unspecified: Secondary | ICD-10-CM

## 2023-07-27 DIAGNOSIS — M79671 Pain in right foot: Secondary | ICD-10-CM

## 2023-07-27 MED ORDER — COLCHICINE 0.6 MG PO TABS
ORAL_TABLET | ORAL | 0 refills | Status: AC
Start: 2023-07-27 — End: ?

## 2023-07-27 NOTE — Telephone Encounter (Signed)
Pt is currently taking augmentin and saw that there is a possible reaction with taking that with colchicine 0.6 MG tablet. Pls advise

## 2023-07-27 NOTE — Telephone Encounter (Signed)
Pt called back to F/U on his inquiry.  CMA was unavailable. Pt was transferred to the Triage Nurse for advice.

## 2023-07-27 NOTE — Progress Notes (Signed)
Established Patient Office Visit   Subjective  Patient ID: Gregg Hamilton, male    DOB: 07/19/1961  Age: 62 y.o. MRN: [id number]  Chief Complaint  Patient presents with   foot pain     Right hallux pain and swelling, started 2 days ago, patient states Monday he was sitting in a car for 12 hr, denies any injury, rat of pain 9 out of 10,     Patient is a 61 year old male followed by Dr. Caryl Never and seen for acute concern.  Patient endorses right foot pain, edema, increased warmth x 2-3 days.  Patient noticed soreness in right foot upon returning from trip to [place].  Patient denies change in food, activity.  May have had more beer and less water.  Patient states that bedsheets touching foot last night was painful.  Pain 9/10.    Patient Active Problem List   Diagnosis Date Noted   Prediabetes 07/05/2022   OSA (obstructive sleep apnea) 04/18/2018   Metabolic syndrome 11/23/2012   Vitamin B 12 deficiency 12/19/2011   Hyperlipidemia 12/19/2011   Laryngotracheobronchitis 08/12/2011   SORE THROAT 10/07/2010   Other B-complex deficiencies 01/22/2010   Allergic rhinitis 11/17/2009   DIARRHEA 11/17/2009   DEPRESSION 10/21/2009   Essential hypertension 10/21/2009   FATIGUE 10/21/2009   Past Medical History:  Diagnosis Date   Allergy    Depression    Heart murmur    Hyperlipidemia    Hypertension    Metabolic syndrome    Sleep apnea    Past Surgical History:  Procedure Laterality Date   CARPAL TUNNEL RELEASE  01/2018   Left hand   VASECTOMY     Social History   Tobacco Use   Smoking status: Never   Smokeless tobacco: Never  Vaping Use   Vaping status: Never Used  Substance Use Topics   Alcohol use: Yes   Drug use: No   Family History  Problem Relation Age of Onset   Diabetes Mother    Hypertension Mother    Diabetes Father    Hypertension Father    Diabetes Sister    Hypertension Other    Cancer Other        colon   Colon cancer Neg Hx    Esophageal  cancer Neg Hx    Stomach cancer Neg Hx    Rectal cancer Neg Hx    No Known Allergies    ROS Negative unless stated above    Objective:     BP 138/82 (BP Location: Left Arm, Patient Position: Sitting, Cuff Size: Large)   Pulse 89   Temp 98.8 F (37.1 C) (Oral)   Ht 5' 9.5" (1.765 m)   Wt 258 lb 3.2 oz (117.1 kg)   SpO2 95%   BMI 37.58 kg/m  BP Readings from Last 3 Encounters:  07/27/23 (!) 158/80  07/13/23 128/82  01/20/23 (!) 142/84   Wt Readings from Last 3 Encounters:  07/27/23 258 lb 3.2 oz (117.1 kg)  07/13/23 252 lb (114.3 kg)  01/20/23 262 lb 6.4 oz (119 kg)      Physical Exam Constitutional:      Appearance: Normal appearance.  HENT:     Head: Normocephalic.     Nose: Nose normal.     Mouth/Throat:     Mouth: Mucous membranes are moist.  Eyes:     Extraocular Movements: Extraocular movements intact.     Conjunctiva/sclera: Conjunctivae normal.  Cardiovascular:     Rate and Rhythm: Normal rate.  Pulmonary:     Effort: Pulmonary effort is normal.  Musculoskeletal:     Right foot: Swelling present.     Left foot: Normal.     Comments: Erythema, increased warmth and tenderness of right great toe and medial midfoot.  Skin:    General: Skin is warm.  Neurological:     Mental Status: He is alert.      No results found for any visits on 07/27/23.    Assessment & Plan:  Right foot pain -     Colchicine; Take 2 tabs (1.2 mg) now.  In 1 hour take 1 tab (0.6 mg).  Then take 1 tab daily until resolution of gout flare.  Dispense: 30 tablet; Refill: 0 -     Uric acid  Acute gout involving toe of right foot, unspecified cause -     Colchicine; Take 2 tabs (1.2 mg) now.  In 1 hour take 1 tab (0.6 mg).  Then take 1 tab daily until resolution of gout flare.  Dispense: 30 tablet; Refill: 0  New problem.  Patient with acute right foot pain concerning for gout.  Start colchicine.  Obtain uric acid.  Discussed other supportive care including elevation, rest,  NSAIDs.  Recent labs reviewed  Return if symptoms worsen or fail to improve.   Deeann Saint, MD

## 2023-07-28 LAB — URIC ACID: Uric Acid, Serum: 5.6 mg/dL (ref 4.0–7.8)

## 2023-07-28 NOTE — Telephone Encounter (Signed)
What is the possible interaction patient is referring to.  Meds are okay to take together.

## 2023-07-29 NOTE — Telephone Encounter (Signed)
Patient informed of the message below and voiced understanding. Patient reported he is not feeling any better and his gout is not improved. Patient inquired if he needs to be treated for something else?

## 2023-07-29 NOTE — Telephone Encounter (Signed)
As pt just started med (yesterday or today) due to concern for a possible med interaction, would advise him to continue course as prescribed for at least 5 days.  Can f/u next wk with pcp for continued or worsened symptoms.

## 2023-08-01 ENCOUNTER — Encounter: Payer: Self-pay | Admitting: Family Medicine

## 2023-08-01 ENCOUNTER — Telehealth: Payer: Self-pay | Admitting: Family Medicine

## 2023-08-01 ENCOUNTER — Ambulatory Visit: Payer: Commercial Managed Care - PPO | Admitting: Family Medicine

## 2023-08-01 VITALS — BP 150/90 | HR 70 | Temp 97.6°F | Ht 69.5 in | Wt 259.2 lb

## 2023-08-01 DIAGNOSIS — M109 Gout, unspecified: Secondary | ICD-10-CM

## 2023-08-01 MED ORDER — PREDNISONE 20 MG PO TABS: ORAL_TABLET | ORAL | 0 refills | Status: DC

## 2023-08-01 NOTE — Telephone Encounter (Signed)
Pt called, returning CMA's call. CMA was with a patient. Pt asked that CMA call back at his earliest convenience. 

## 2023-08-01 NOTE — Telephone Encounter (Signed)
Pt would like to know if he needs to discontinue his medication: colchicine. Pt is requesting a call back.

## 2023-08-01 NOTE — Telephone Encounter (Signed)
Patient informed of the message below and voiced understanding  

## 2023-08-01 NOTE — Telephone Encounter (Signed)
Left a message for the patient to return my call.  

## 2023-08-01 NOTE — Progress Notes (Signed)
Established Patient Office Visit  Subjective   Patient ID: Gregg Hamilton, male    DOB: 1961/01/14  Age: 62 y.o. MRN: [id number]  Chief Complaint  Patient presents with   Medical Management of Chronic Issues    HPI   Gregg Hamilton is seen today for ongoing right foot pain MTP joint.  Was seen here last week on Wednesday with suspected acute gout.  His symptoms started on Monday.  No history of known gout but he has had 1 previous similar flareup.  No known family history of gout.  Denies any recent injury.  No fevers or chills.  He was placed on colchicine 0.5 mg twice daily but has not seen much improvement if any.  He does take lisinopril HCTZ for hypertension.  Some recent beer consumption but no binge drinking.  Very little red meat consumption no recent shellfish or other high purine foods.  He had uric acid which came back 5.6  Past Medical History:  Diagnosis Date   Allergy    Depression    Heart murmur    Hyperlipidemia    Hypertension    Metabolic syndrome    Sleep apnea    Past Surgical History:  Procedure Laterality Date   CARPAL TUNNEL RELEASE  01/2018   Left hand   VASECTOMY      reports that he has never smoked. He has never used smokeless tobacco. He reports current alcohol use. He reports that he does not use drugs. family history includes Cancer in an other family member; Diabetes in his father, mother, and sister; Hypertension in his father, mother, and another family member. No Known Allergies  Review of Systems  Constitutional:  Negative for chills and fever.      Objective:     BP (!) 150/90 (BP Location: Left Arm, Cuff Size: Normal)   Pulse 70   Temp 97.6 F (36.4 C) (Oral)   Ht 5' 9.5" (1.765 m)   Wt 259 lb 3.2 oz (117.6 kg)   SpO2 97%   BMI 37.73 kg/m  BP Readings from Last 3 Encounters:  08/01/23 (!) 150/90  07/27/23 138/82  07/13/23 128/82   Wt Readings from Last 3 Encounters:  08/01/23 259 lb 3.2 oz (117.6 kg)  07/27/23 258 lb 3.2 oz  (117.1 kg)  07/13/23 252 lb (114.3 kg)      Physical Exam Vitals reviewed.  Constitutional:      Appearance: Normal appearance.  Cardiovascular:     Rate and Rhythm: Normal rate and regular rhythm.  Musculoskeletal:     Comments: Right foot reveals some mild swelling MTP joint with mild warmth and mild erythema.  No breaks in the skin.  Ankle exam normal.  Neurological:     Mental Status: He is alert.      No results found for any visits on 08/01/23.  Last CBC Lab Results  Component Value Date   WBC 7.4 07/13/2023   HGB 14.4 07/13/2023   HCT 43.9 07/13/2023   MCV 88.3 07/13/2023   RDW 14.1 07/13/2023   PLT 243.0 07/13/2023   Last metabolic panel Lab Results  Component Value Date   GLUCOSE 91 07/13/2023   NA 137 07/13/2023   K 4.5 07/13/2023   CL 97 07/13/2023   CO2 31 07/13/2023   BUN 19 07/13/2023   CREATININE 1.01 07/13/2023   GFR 79.03 07/13/2023   CALCIUM 9.9 07/13/2023   PROT 7.4 07/13/2023   ALBUMIN 4.5 07/13/2023   BILITOT 0.5 07/13/2023  ALKPHOS 79 07/13/2023   AST 16 07/13/2023   ALT 25 07/13/2023      The 10-year ASCVD risk score (Arnett DK, et al., 2019) is: 20.7%    Assessment & Plan:   #1 acute pain involving right foot MTP joint.  Do suspect acute gout given his presentation with sudden onset, lack of injury, no evidence for infection.  We did explain that his normal uric acid does not rule out gout.  We recommend he continue low purine diet with handout given.  Stay well-hydrated.  Avoid regular consumption of beer.  Start prednisone 20 mg 2 tablets daily for 6 days.  Be in touch if symptoms not greatly improved in 2 to 3 days We also did discuss possible x-ray but since he has only had less than a week of symptoms decided to hold on that at this time with no recent injury reported  Would also consider possible switching off HCTZ for blood pressure if he continues to have flareups.  #2 hypertension.  History of whitecoat syndrome.  Blood  pressure consistently well-controlled 130/80 or less at home.  Switch off HCTZ if more frequent future flareups of gout  Evelena Peat, MD

## 2023-08-05 ENCOUNTER — Ambulatory Visit: Payer: Commercial Managed Care - PPO

## 2023-08-05 ENCOUNTER — Ambulatory Visit: Payer: Commercial Managed Care - PPO | Admitting: Family Medicine

## 2023-08-05 ENCOUNTER — Encounter: Payer: Self-pay | Admitting: Family Medicine

## 2023-08-05 VITALS — BP 140/76 | HR 88 | Temp 98.2°F | Ht 69.5 in | Wt 260.9 lb

## 2023-08-05 DIAGNOSIS — M79671 Pain in right foot: Secondary | ICD-10-CM

## 2023-08-05 NOTE — Progress Notes (Unsigned)
   Established Patient Office Visit  Subjective   Patient ID: Gregg Hamilton, male    DOB: 1960/11/18  Age: 62 y.o. MRN: [id number]  Chief Complaint  Patient presents with   Foot Swelling    HPI  {History (Optional):23778} Seen with some persistent swelling and pain right foot MTP joint.  Suspected gout.  Refer to prior notes for details.  Pain came on relatively acutely.  No known injury.  Was initially placed on colchicine but did not see much improvement.  Is currently on prednisone.  This was started few days ago.  He states his pain is about 25% improved.  He was expecting for somewhat more.  No prior known history of gout.  Recent uric acid 5.6%.  No fevers or chills.  Past Medical History:  Diagnosis Date   Allergy    Depression    Heart murmur    Hyperlipidemia    Hypertension    Metabolic syndrome    Sleep apnea    Past Surgical History:  Procedure Laterality Date   CARPAL TUNNEL RELEASE  01/2018   Left hand   VASECTOMY      reports that he has never smoked. He has never used smokeless tobacco. He reports current alcohol use. He reports that he does not use drugs. family history includes Cancer in an other family member; Diabetes in his father, mother, and sister; Hypertension in his father, mother, and another family member. No Known Allergies  Review of Systems  Constitutional:  Negative for chills and fever.      Objective:     BP (!) 140/76 (BP Location: Right Arm, Patient Position: Sitting, Cuff Size: Large)   Pulse 88   Temp 98.2 F (36.8 C) (Oral)   Ht 5' 9.5" (1.765 m)   Wt 260 lb 14.4 oz (118.3 kg)   SpO2 97%   BMI 37.98 kg/m  BP Readings from Last 3 Encounters:  08/05/23 (!) 140/76  08/01/23 (!) 150/90  07/27/23 138/82   Wt Readings from Last 3 Encounters:  08/05/23 260 lb 14.4 oz (118.3 kg)  08/01/23 259 lb 3.2 oz (117.6 kg)  07/27/23 258 lb 3.2 oz (117.1 kg)      Physical Exam Vitals reviewed.  Constitutional:      General: He is  not in acute distress. Cardiovascular:     Rate and Rhythm: Normal rate and regular rhythm.  Musculoskeletal:     Comments: Foot reveals mild swelling first MTP joint.  Less tender to palpation.  No significant warmth noted today.  No cellulitis changes.  Neurological:     Mental Status: He is alert.      No results found for any visits on 08/05/23.  {Labs (Optional):23779}  The 10-year ASCVD risk score (Arnett DK, et al., 2019) is: 18.6%    Assessment & Plan:   Problem List Items Addressed This Visit   None Visit Diagnoses     Right foot pain    -  Primary   Relevant Orders   DG Foot Complete Right     Acute onset of pain first MTP joint.  Suspected gout.  Had essentially no improvement with colchicine and has seen some mild improvement with prednisone. -Obtain x-ray right foot  No follow-ups on file.    Evelena Peat, MD

## 2023-08-05 NOTE — Patient Instructions (Signed)
X-rays show some mild arthritis but no erosions.  Let me know if not continuing to get better by next week.

## 2023-08-15 ENCOUNTER — Ambulatory Visit (HOSPITAL_BASED_OUTPATIENT_CLINIC_OR_DEPARTMENT_OTHER)
Admission: RE | Admit: 2023-08-15 | Discharge: 2023-08-15 | Disposition: A | Discharge status: Home / Self Care | Payer: Commercial Managed Care - PPO | Source: Ambulatory Visit | Attending: Family Medicine | Admitting: Family Medicine

## 2023-08-15 ENCOUNTER — Ambulatory Visit: Payer: Commercial Managed Care - PPO | Admitting: Family Medicine

## 2023-08-15 ENCOUNTER — Encounter: Payer: Self-pay | Admitting: Family Medicine

## 2023-08-15 VITALS — BP 160/86 | HR 95 | Temp 98.0°F | Ht 69.5 in | Wt 262.8 lb

## 2023-08-15 DIAGNOSIS — I10 Essential (primary) hypertension: Secondary | ICD-10-CM | POA: Diagnosis not present

## 2023-08-15 DIAGNOSIS — E785 Hyperlipidemia, unspecified: Secondary | ICD-10-CM | POA: Insufficient documentation

## 2023-08-15 DIAGNOSIS — M79671 Pain in right foot: Secondary | ICD-10-CM | POA: Diagnosis not present

## 2023-08-15 MED ORDER — LISINOPRIL 20 MG PO TABS: 20.0000 mg | ORAL_TABLET | Freq: Every day | ORAL | 3 refills | Status: DC

## 2023-08-15 MED ORDER — AMLODIPINE BESYLATE 5 MG PO TABS: 5.0000 mg | ORAL_TABLET | Freq: Every day | ORAL | 3 refills | Status: DC

## 2023-08-15 MED ORDER — PREDNISONE 20 MG PO TABS: ORAL_TABLET | ORAL | 0 refills | Status: DC

## 2023-08-15 NOTE — Progress Notes (Signed)
Established Patient Office Visit  Subjective   Patient ID: Gregg Hamilton, male    DOB: 07-Jun-1961  Age: 62 y.o. MRN: [id number]  Chief Complaint  Patient presents with   Foot Pain    Patient complains of right foot pain,     HPI   Ongoing right foot pain MTP joint with associated swelling and mild erythema and warmth.  No recent reported injury.  Initial suspicion was gout and he was placed on colchicine without much improvement.  We started prednisone 20 mg 2 tablets daily for 5 days and he saw a little bit of improvement after couple days but then seemed to lose its effect.  Denies any injury.  No fever or chills.  No history of known gout.  Recent uric acid 5.6.  Does take lisinopril HCTZ for hypertension.  Blood pressure up today and not well-controlled last visit either.  We had previously discussed possibly getting off HCTZ with strong clinical suspicion for gout.  We obtained plain films of the foot 10 days ago still not over read but did not show any erosions or other acute abnormalities.  Past Medical History:  Diagnosis Date   Allergy    Depression    Heart murmur    Hyperlipidemia    Hypertension    Metabolic syndrome    Sleep apnea    Past Surgical History:  Procedure Laterality Date   CARPAL TUNNEL RELEASE  01/2018   Left hand   VASECTOMY      reports that he has never smoked. He has never used smokeless tobacco. He reports current alcohol use. He reports that he does not use drugs. family history includes Cancer in an other family member; Diabetes in his father, mother, and sister; Hypertension in his father, mother, and another family member. No Known Allergies  Review of Systems  Constitutional:  Negative for chills and fever.  Gastrointestinal:  Negative for nausea and vomiting.      Objective:     BP (!) 160/86 (BP Location: Left Arm, Patient Position: Sitting, Cuff Size: Normal)   Pulse 95   Temp 98 F (36.7 C) (Oral)   Ht 5' 9.5" (1.765 m)   Wt  262 lb 12.8 oz (119.2 kg)   SpO2 95%   BMI 38.25 kg/m  BP Readings from Last 3 Encounters:  08/15/23 (!) 160/86  08/05/23 (!) 140/76  08/01/23 (!) 150/90   Wt Readings from Last 3 Encounters:  08/15/23 262 lb 12.8 oz (119.2 kg)  08/05/23 260 lb 14.4 oz (118.3 kg)  08/01/23 259 lb 3.2 oz (117.6 kg)      Physical Exam Vitals reviewed.  Constitutional:      General: He is not in acute distress.    Appearance: He is not ill-appearing.  Cardiovascular:     Rate and Rhythm: Normal rate and regular rhythm.  Musculoskeletal:     Comments: Right foot reveals some swelling at the MTP joint with mild erythema and warmth and tenderness confined to the MTP joint.  No breaks in the skin.  Neurological:     Mental Status: He is alert.      No results found for any visits on 08/15/23.  Last CBC Lab Results  Component Value Date   WBC 7.4 07/13/2023   HGB 14.4 07/13/2023   HCT 43.9 07/13/2023   MCV 88.3 07/13/2023   RDW 14.1 07/13/2023   PLT 243.0 07/13/2023   Last metabolic panel Lab Results  Component Value Date  GLUCOSE 91 07/13/2023   NA 137 07/13/2023   K 4.5 07/13/2023   CL 97 07/13/2023   CO2 31 07/13/2023   BUN 19 07/13/2023   CREATININE 1.01 07/13/2023   GFR 79.03 07/13/2023   CALCIUM 9.9 07/13/2023   PROT 7.4 07/13/2023   ALBUMIN 4.5 07/13/2023   BILITOT 0.5 07/13/2023   ALKPHOS 79 07/13/2023   AST 16 07/13/2023   ALT 25 07/13/2023      The 10-year ASCVD risk score (Arnett DK, et al., 2019) is: 23%    Assessment & Plan:   #1 persistent right foot pain MTP joint.  He basically has acute monoarticular arthritis.  No history of recent injury and x-ray showed no acute findings.  No recent fever and low clinical suspicion for septic joint.  Gout was seen most likely although no prior history of known gout.  -Prednisone taper starting at 60 mg and taper little bit longer this time. -If symptoms are resolving with the above recommend uric acid when he is not  having acute flareup to reassess -If not responding to prednisone above recommend orthopedic referral  #2 hypertension suboptimally controlled.  We have previously discussed stopping his HCTZ as this could exacerbate his gout.  Will stop lisinopril-HCTZ and start plain lisinopril 20 mg daily and amlodipine 5 mg daily.  Set up 1 month follow-up to reassess blood pressure Evelena Peat, MD

## 2023-08-15 NOTE — Patient Instructions (Signed)
Stop the Lisinopril hydrochlorothiazide  Start the two new BP medications and set up one month follow up.

## 2023-09-05 ENCOUNTER — Encounter: Payer: Self-pay | Admitting: Family Medicine

## 2023-09-05 ENCOUNTER — Ambulatory Visit: Payer: Commercial Managed Care - PPO | Admitting: Family Medicine

## 2023-09-05 VITALS — BP 130/80 | HR 78 | Temp 97.8°F | Ht 69.5 in | Wt 271.3 lb

## 2023-09-05 DIAGNOSIS — M109 Gout, unspecified: Secondary | ICD-10-CM | POA: Diagnosis not present

## 2023-09-05 MED ORDER — PREDNISONE 20 MG PO TABS: ORAL_TABLET | ORAL | 0 refills | Status: DC

## 2023-09-05 MED ORDER — COLCHICINE 0.6 MG PO TABS: 0.6000 mg | ORAL_TABLET | Freq: Two times a day (BID) | ORAL | 2 refills | Status: AC

## 2023-09-05 NOTE — Progress Notes (Unsigned)
   Established Patient Office Visit  Subjective   Patient ID: Gregg Hamilton, male    DOB: 14-Mar-1961  Age: 62 y.o. MRN: [id number]  Chief Complaint  Patient presents with   Foot Pain    Patient complains of right foot pain    HPI  {History (Optional):23778} Recurrent right foot pain MTP joint.  Refer to multiple prior notes for details.  Was initially thought to have gout but had normal uric acid.  Subsequent x-ray revealed no acute bony findings.  He has had some warmth and swelling consistent with likely acute gout.  No prior history of gout.  No known injury.  Has not any fevers or chills or other suggestion of septic joint.  No other joint involvement.  Has improved slightly with prednisone but never fully resolved.  He was having severe pain last Thursday and Friday and started back once daily colchicine over the weekend and pain is some improved at this time. Staying well-hydrated and trying to follow low purine type diet.  Past Medical History:  Diagnosis Date   Allergy    Depression    Heart murmur    Hyperlipidemia    Hypertension    Metabolic syndrome    Sleep apnea    Past Surgical History:  Procedure Laterality Date   CARPAL TUNNEL RELEASE  01/2018   Left hand   VASECTOMY      reports that he has never smoked. He has never used smokeless tobacco. He reports current alcohol use. He reports that he does not use drugs. family history includes Cancer in an other family member; Diabetes in his father, mother, and sister; Hypertension in his father, mother, and another family member. No Known Allergies  Review of Systems  Constitutional:  Negative for chills and fever.      Objective:     BP 130/80 (BP Location: Left Arm, Patient Position: Sitting, Cuff Size: Large)   Pulse 78   Temp 97.8 F (36.6 C) (Oral)   Ht 5' 9.5" (1.765 m)   Wt 271 lb 4.8 oz (123.1 kg)   SpO2 97%   BMI 39.49 kg/m  {Vitals History (Optional):23777}  Physical Exam Vitals reviewed.   Constitutional:      General: He is not in acute distress.    Appearance: He is not ill-appearing.  Cardiovascular:     Rate and Rhythm: Normal rate.  Musculoskeletal:     Comments: Foot reveals some swelling and mild warmth confined to the MTP joint.  No cellulitis changes.  Mild tenderness to palpation  Neurological:     Mental Status: He is alert.      No results found for any visits on 09/05/23.  {Labs (Optional):23779}  The 10-year ASCVD risk score (Arnett DK, et al., 2019) is: 16.4%    Assessment & Plan:   Recurrent right foot pain involving right MTP joint with some warmth and swelling.  No history of injury.  No evidence for septic joint.  Has been somewhat atypical and that he never seem to have full resolution of pain with prednisone alone.  -Start back prednisone 60 mg daily for 6 days -Add colchicine 0.6 mg twice daily -Given atypical features consider rheumatology consult. Evelena Peat, MD

## 2023-09-05 NOTE — Patient Instructions (Signed)
I will try to get you in to see Rheumatologist soon.

## 2023-09-10 ENCOUNTER — Encounter (HOSPITAL_BASED_OUTPATIENT_CLINIC_OR_DEPARTMENT_OTHER): Payer: Self-pay

## 2023-09-10 ENCOUNTER — Other Ambulatory Visit: Payer: Self-pay

## 2023-09-10 ENCOUNTER — Emergency Department (HOSPITAL_BASED_OUTPATIENT_CLINIC_OR_DEPARTMENT_OTHER): Payer: Commercial Managed Care - PPO

## 2023-09-10 ENCOUNTER — Emergency Department (HOSPITAL_BASED_OUTPATIENT_CLINIC_OR_DEPARTMENT_OTHER)
Admission: EM | Admit: 2023-09-10 | Discharge: 2023-09-11 | Disposition: A | Discharge status: Home / Self Care | Payer: Commercial Managed Care - PPO | Attending: Emergency Medicine | Admitting: Emergency Medicine

## 2023-09-10 DIAGNOSIS — S61215A Laceration without foreign body of left ring finger without damage to nail, initial encounter: Secondary | ICD-10-CM | POA: Diagnosis present

## 2023-09-10 DIAGNOSIS — Z79899 Other long term (current) drug therapy: Secondary | ICD-10-CM | POA: Diagnosis not present

## 2023-09-10 DIAGNOSIS — I10 Essential (primary) hypertension: Secondary | ICD-10-CM | POA: Insufficient documentation

## 2023-09-10 DIAGNOSIS — Y92838 Other recreation area as the place of occurrence of the external cause: Secondary | ICD-10-CM | POA: Insufficient documentation

## 2023-09-10 DIAGNOSIS — W07XXXA Fall from chair, initial encounter: Secondary | ICD-10-CM | POA: Insufficient documentation

## 2023-09-10 MED ORDER — LIDOCAINE HCL (PF) 1 % IJ SOLN
30.0000 mL | Freq: Once | INTRAMUSCULAR | Status: AC
  Administered 2023-09-10: 30 mL
  Filled 2023-09-10: qty 30

## 2023-09-10 MED ORDER — CEPHALEXIN 500 MG PO CAPS: 500.0000 mg | ORAL_CAPSULE | Freq: Four times a day (QID) | ORAL | 0 refills | Status: AC

## 2023-09-10 NOTE — Discharge Instructions (Addendum)
Your sutures need to come out in 10 days.  Leave the dressing on your laceration for the next 24 hours. After that, you can leave the wound open to air. After the first 24 hours, you can begin to clean the wound site with soap and water to prevent crusting over the suture knots.  Do not go into pools, lakes, or oceans until the sutures are removed in order to prevent infection.   Take Keflex 4x a day for 7 days to ensure no infection develops.  Follow up with hand surgery for reevaluation.  Get help right away if: You have very bad swelling around the wound. Your pain suddenly gets worse and is very bad. You have painful lumps near the wound or on skin anywhere on your body. You have a red streak going away from your wound. The wound is on your hand or foot, and: You cannot move a finger or toe. Your fingers or toes look pale or bluish.

## 2023-09-10 NOTE — ED Provider Notes (Signed)
[facility] AT [facility] Provider Note   CSN: [id number] Arrival date & time: 09/10/23  1932     History {Add pertinent medical, surgical, social history, OB history to HPI:1} Chief Complaint  Patient presents with  . Laceration    Gregg Hamilton is a 62 y.o. male with a history of hypertension who presents the ED today for a laceration.  Patient reports that he was at a Christmas party and fell out of a chair while holding a glass cup and cut his left finger at the tip and medial aspect. He denies any other injuries with the fall.  No blood thinner use.  His tetanus vaccination is up-to-date.     Home Medications Prior to Admission medications   Medication Sig Start Date End Date Taking? Authorizing Provider  amLODipine (NORVASC) 5 MG tablet Take 1 tablet (5 mg total) by mouth daily. 08/15/23   Burchette, Elberta Fortis, MD  amoxicillin-clavulanate (AUGMENTIN) 875-125 MG tablet Take 1 tablet by mouth 2 (two) times daily. 07/23/23   [provider]  clobetasol cream (TEMOVATE) 0.05 % Apply topically once or twice daily 01/25/22   Burchette, Elberta Fortis, MD  colchicine 0.6 MG tablet Take 2 tabs (1.2 mg) now.  In 1 hour take 1 tab (0.6 mg).  Then take 1 tab daily until resolution of gout flare. 07/27/23   Deeann Saint, MD  colchicine 0.6 MG tablet Take 1 tablet (0.6 mg total) by mouth 2 (two) times daily. 09/05/23   Burchette, Elberta Fortis, MD  lisinopril (ZESTRIL) 20 MG tablet Take 1 tablet (20 mg total) by mouth daily. 08/15/23   Burchette, Elberta Fortis, MD  predniSONE (DELTASONE) 20 MG tablet Taper as follows over 9 days : 3-3-3-2-2-2-1-1-1 08/15/23   Kristian Covey, MD  predniSONE (DELTASONE) 20 MG tablet Take three tablets by mouth once daily for 6 days. 09/05/23   Burchette, Elberta Fortis, MD  cetirizine (ZYRTEC) 10 MG tablet Take 10 mg by mouth daily.    12/17/11  [provider]      Allergies    Patient has no known allergies.    Review of Systems    Review of Systems  Skin:        Finger injury  All other systems reviewed and are negative.   Physical Exam Updated Vital Signs BP 139/82   Pulse 93   Temp 97.6 F (36.4 C)   Resp 15   Ht 5\' 10"  (1.778 m)   Wt 122.5 kg   SpO2 96%   BMI 38.74 kg/m  Physical Exam  ED Results / Procedures / Treatments   Labs (all labs ordered are listed, but only abnormal results are displayed) Labs Reviewed - No data to display  EKG None  Radiology No results found.  Procedures .Laceration Repair  Date/Time: 09/10/2023 11:08 PM  Performed by: Maxwell Marion, PA-C Authorized by: Maxwell Marion, PA-C   Consent:    Consent obtained:  Verbal   Consent given by:  Patient   Risks discussed:  Infection Laceration details:    Location:  Finger   Finger location:  L ring finger   Length (cm):  10 Exploration:    Hemostasis achieved with:  Tourniquet and direct pressure Treatment:    Area cleansed with:  Saline Skin repair:    Repair method:  Sutures   Suture size:  6-0   Suture material:  Prolene   Suture technique:  Simple interrupted   Number of sutures:  13  Approximation:    Approximation:  Close Repair type:    Repair type:  Simple Post-procedure details:    Dressing:  Antibiotic ointment   Procedure completion:  Tolerated well, no immediate complications   {Document cardiac monitor, telemetry assessment procedure when appropriate:1}  Medications Ordered in ED Medications  lidocaine (PF) (XYLOCAINE) 1 % injection 30 mL (has no administration in time range)    ED Course/ Medical Decision Making/ A&P   {   Click here for ABCD2, HEART and other calculatorsREFRESH Note before signing :1}                              Medical Decision Making Risk Prescription drug management.   This patient presents to the ED for concern of ***, this involves an extensive number of treatment options, and is a complaint that carries with it a high risk of complications and morbidity.    Differential diagnosis includes: ***   Comorbidities  See HPI above   Additional History  Additional history obtained from prior records.   Lab Tests  I ordered and personally interpreted labs.  The pertinent results include:  ***   Imaging Studies  I ordered imaging studies including ***  I independently visualized and interpreted imaging which showed: *** I agree with the radiologist interpretation   Consultations  I requested consultation with ***,  and discussed lab and imaging findings as well as pertinent plan - they recommend: ***   Problem List / ED Course / Critical Interventions / Medication Management  *** I ordered medications including: *** for ***  Reevaluation of the patient after these medicines showed that the patient {resolved/improved/worsened:23923::"improved"} I have reviewed the patients home medicines and have made adjustments as needed   Social Determinants of Health  ***   Test / Admission - Considered  ***   {Document critical care time when appropriate:1} {Document review of labs and clinical decision tools ie heart score, Chads2Vasc2 etc:1}  {Document your independent review of radiology images, and any outside records:1} {Document your discussion with family members, caretakers, and with consultants:1} {Document social determinants of health affecting pt's care:1} {Document your decision making why or why not admission, treatments were needed:1} Final Clinical Impression(s) / ED Diagnoses Final diagnoses:  None    Rx / DC Orders ED Discharge Orders     None

## 2023-09-10 NOTE — ED Triage Notes (Signed)
Pt POV from party where he fell out of chair and cut his L finger on glass he was holding. Bleeding controlled in triage, wrapped with guaze and coban.

## 2023-09-14 ENCOUNTER — Ambulatory Visit: Payer: Commercial Managed Care - PPO | Admitting: Family Medicine

## 2023-09-20 ENCOUNTER — Ambulatory Visit: Payer: Commercial Managed Care - PPO | Admitting: Family Medicine

## 2023-09-20 ENCOUNTER — Encounter: Payer: Self-pay | Admitting: Family Medicine

## 2023-09-20 VITALS — BP 150/74 | HR 88 | Temp 97.7°F | Wt 269.0 lb

## 2023-09-20 DIAGNOSIS — S61215D Laceration without foreign body of left ring finger without damage to nail, subsequent encounter: Secondary | ICD-10-CM

## 2023-09-20 DIAGNOSIS — R051 Acute cough: Secondary | ICD-10-CM

## 2023-09-20 DIAGNOSIS — M109 Gout, unspecified: Secondary | ICD-10-CM | POA: Diagnosis not present

## 2023-09-20 MED ORDER — HYDROCODONE BIT-HOMATROP MBR 5-1.5 MG/5ML PO SOLN: 5.0000 mL | Freq: Three times a day (TID) | ORAL | 0 refills | Status: AC | PRN

## 2023-09-20 MED ORDER — INDOMETHACIN 50 MG PO CAPS: 50.0000 mg | ORAL_CAPSULE | Freq: Three times a day (TID) | ORAL | 0 refills | Status: AC

## 2023-09-20 NOTE — Progress Notes (Signed)
Established Patient Office Visit  Subjective   Patient ID: Gregg Hamilton, male    DOB: 05-10-1961  Age: 62 y.o. MRN: [id number]  Chief Complaint  Patient presents with   Suture / Staple Removal   Cough    Patient complains of cough, Non productive, x5 days     HPI   [name] is seen for the following items  Laceration left ring finger week ago Saturday.  He fell out of a chair while holding a glass and cut his left ring finger on the tip and medial aspect..  He had 13 sutures placed.  Here today for suture removal.  Healing well with no complications  Also relates cough and nasal congestion.  Onset last Thursday.  Wife had similar symptoms.  No fever.  Cough has been prickly bothersome.  Interfering with sleep.  Tried DayQuil without relief.  Requesting cough medication.  No dyspnea.  No obvious wheezing.  Recent issues with recurrent swelling and pain of the foot with suspected gout.  Last course of prednisone did help.  Unfortunately after coming off the prednisone is now having some early recurrent symptoms again.  He has pending follow-up with rheumatologist soon.  Past Medical History:  Diagnosis Date   Allergy    Depression    Heart murmur    Hyperlipidemia    Hypertension    Metabolic syndrome    Sleep apnea    Past Surgical History:  Procedure Laterality Date   CARPAL TUNNEL RELEASE  01/2018   Left hand   VASECTOMY      reports that he has never smoked. He has never used smokeless tobacco. He reports current alcohol use. He reports that he does not use drugs. family history includes Cancer in an other family member; Diabetes in his father, mother, and sister; Hypertension in his father, mother, and another family member. No Known Allergies  Review of Systems  Constitutional:  Negative for chills and fever.  HENT:  Positive for congestion.   Respiratory:  Positive for cough.       Objective:     BP (!) 150/74 (BP Location: Left Arm, Patient Position:  Sitting, Cuff Size: Normal)   Pulse 88   Temp 97.7 F (36.5 C) (Oral)   Wt 269 lb (122 kg)   SpO2 96%   BMI 38.60 kg/m  BP Readings from Last 3 Encounters:  09/20/23 (!) 150/74  09/10/23 125/64  09/05/23 130/80   Wt Readings from Last 3 Encounters:  09/20/23 269 lb (122 kg)  09/10/23 270 lb (122.5 kg)  09/05/23 271 lb 4.8 oz (123.1 kg)      Physical Exam Constitutional:      Appearance: He is well-developed.  HENT:     Right Ear: External ear normal.     Left Ear: External ear normal.  Eyes:     Pupils: Pupils are equal, round, and reactive to light.  Neck:     Thyroid: No thyromegaly.  Cardiovascular:     Rate and Rhythm: Normal rate and regular rhythm.  Pulmonary:     Effort: Pulmonary effort is normal. No respiratory distress.     Breath sounds: Normal breath sounds. No wheezing or rales.  Musculoskeletal:     Cervical back: Neck supple.  Skin:    Comments: Left ring finger reveals couple lacerations including 1 on the medial aspect of the finger distally and one of the tip which are healing well.  13 sutures are removed without difficulty.  No signs  of infection.  Full range of motion all digits  Neurological:     Mental Status: He is alert and oriented to person, place, and time.      No results found for any visits on 09/20/23.    The 10-year ASCVD risk score (Arnett DK, et al., 2019) is: 20.7%    Assessment & Plan:   #1 healing lacerations left ring finger.  Sutures removed without difficulty.  No signs of secondary infection  #2 cough.  Suspect acute viral bronchitis.  Lung exam clear.  Wrote for limited Hycodan cough syrup 1 teaspoon nightly for severe cough.  Follow-up promptly for fever or worsening symptoms  #3 recurrent foot pain MTP joint with suspected gout.  We wrote for limited Indocin 50 mg 1 every 8 hours as needed and take with food.  Caution about potential side effects-especially GI  Evelena Peat, MD

## 2023-10-11 NOTE — Progress Notes (Unsigned)
Office Visit Note  Patient: Gregg Hamilton             Date of Birth: 08/22/1961           MRN: [id number]             PCP: Kristian Covey, MD Referring: Kristian Covey, MD Visit Date: 10/20/2023 Occupation: @GUAROCC @  Subjective:  Right great toe pain  History of Present Illness: Gregg Hamilton is a 63 y.o. male who presents today for a new patient consultation as requested by his PCP, Dr. Caryl Never.  Patient reports that about 12 weeks ago he was driving back from [place] and started to experience pain in his right great toe.  He denies any injury prior to the onset of symptoms.  He was evaluated at his primary care's office on 07/27/2023 by Dr. Abbe Amsterdam and was diagnosed with new onset acute gout.  His uric acid levels obtained at that time and he was started on a course of colchicine.  Patient continued to have persistent symptoms and was reevaluated at his PCPs office on 08/01/2023.  At that time he was prescribed a prednisone taper starting at 40 mg daily x6 which provided relief for the first 2 days but then his symptoms returned.  He was reevaluated by his PCP for ongoing right first MTP joint pain on 08/05/2023.  At that time x-rays of the right foot were obtained-no acute fracture.  He followed back up with his PCP on 08/15/2023 and was started on a higher dose of prednisone starting at 60 mg daily.  Patient states that he never had complete resolution of pain while taking prednisone.  He was reevaluated on 09/05/2023 at which time he was restarted on prednisone 60 mg daily for 6 days and was restarted on colchicine twice daily.  He was also referred to [place] at that time.  He was reevaluated by his PCP on 09/20/2023 at which time he was given a course of indomethacin to try.  Patient states that he has continued to have frequent flares.  His most recent flare was on Sunday at which time he has tried taking ibuprofen.  He currently rates the pain a 4 out of 10 but during flares it  can be as high as a 9 out of 10.  During flares even a bedsheet exacerbates pain of the right great toe.  Patient denies any other joint pain or joint swelling at this time.  Patient states he has had a history of plantar fasciitis but denies any Achilles tendinitis.  He denies any SI joint pain.  He denies any known history of psoriasis, Crohn's disease, or ulcerative colitis.  He denies any known family history of gout or any other rheumatologic conditions. Patient states that he has done some research about gout and has been trying to avoid dietary triggers.  Patient states he has been doing dry January and has not had any alcohol recently.  Patient states that he typically avoids red meat and seafood and most commonly eats chicken.    Activities of Daily Living:  Patient reports morning stiffness for 0 minutes.   Patient Reports nocturnal pain.  Difficulty dressing/grooming: Denies Difficulty climbing stairs: Denies Difficulty getting out of chair: Denies Difficulty using hands for taps, buttons, cutlery, and/or writing: Denies  Review of Systems  Constitutional:  Negative for fatigue.  HENT:  Negative for mouth sores, mouth dryness and nose dryness.   Eyes:  Negative for pain, visual  disturbance and dryness.  Respiratory:  Negative for shortness of breath and difficulty breathing.   Cardiovascular:  Negative for chest pain, palpitations and swelling in legs/feet.  Gastrointestinal:  Negative for blood in stool, constipation and diarrhea.  Endocrine: Negative for increased urination.  Genitourinary:  Negative for involuntary urination.  Musculoskeletal:  Positive for joint pain, gait problem, joint pain and joint swelling. Negative for myalgias, muscle weakness, morning stiffness, muscle tenderness and myalgias.  Skin:  Negative for color change, rash, hair loss and sensitivity to sunlight.  Allergic/Immunologic: Negative for susceptible to infections.  Neurological:  Negative for  dizziness and headaches.  Hematological:  Negative for swollen glands.  Psychiatric/Behavioral:  Negative for depressed mood and sleep disturbance. The patient is not nervous/anxious.     PMFS History:  Patient Active Problem List   Diagnosis Date Noted   Prediabetes 07/05/2022   OSA (obstructive sleep apnea) 04/18/2018   Metabolic syndrome 11/23/2012   Vitamin B 12 deficiency 12/19/2011   Hyperlipidemia 12/19/2011   Laryngotracheobronchitis 08/12/2011   SORE THROAT 10/07/2010   Other B-complex deficiencies 01/22/2010   Allergic rhinitis 11/17/2009   DIARRHEA 11/17/2009   DEPRESSION 10/21/2009   Essential hypertension 10/21/2009   FATIGUE 10/21/2009    Past Medical History:  Diagnosis Date   Allergy    Depression    Heart murmur    Hyperlipidemia    Hypertension    Metabolic syndrome    Sleep apnea     Family History  Problem Relation Age of Onset   Diabetes Mother    Hypertension Mother    Diabetes Father    Hypertension Father    Diabetes Sister    Hypertension Other    Cancer Other        colon   Healthy Son    Healthy Daughter    Colon cancer Neg Hx    Esophageal cancer Neg Hx    Stomach cancer Neg Hx    Rectal cancer Neg Hx    Past Surgical History:  Procedure Laterality Date   CARPAL TUNNEL RELEASE  01/2018   Left hand   VASECTOMY     Social History   Social History Narrative   Married no tobacco    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  Constitutional:      Appearance: He is well-developed.  HENT:     Head: Normocephalic and atraumatic.  Eyes:     Conjunctiva/sclera: Conjunctivae  normal.     Pupils: Pupils are equal, round, and reactive to light.  Cardiovascular:     Rate and Rhythm: Normal rate and regular rhythm.     Heart sounds: Normal heart sounds.  Pulmonary:     Effort: Pulmonary effort is normal.     Breath sounds: Normal breath sounds.  Abdominal:     General: Bowel sounds are normal.     Palpations: Abdomen is soft.  Musculoskeletal:     Cervical back: Normal range of motion and neck supple.  Skin:    General: Skin is warm and dry.     Capillary  Refill: Capillary refill takes less than 2 seconds.  Neurological:     Mental Status: He is alert and oriented to person, place, and time.  Psychiatric:        Behavior: Behavior normal.      Musculoskeletal Exam: C-spine, thoracic spine, lumbar spine have good range of motion.  Shoulder joints, elbow joints, wrist joints, MCPs, PIPs, DIPs have good range of motion with no synovitis.  Complete fist formation bilaterally.  Hip joints have good range of motion with no groin pain.  Knee joints have good range of motion no warmth or effusion.  Ankle joints have good range of motion with no tenderness or joint swelling.  No evidence of Achilles tendinitis or plantar fasciitis.  He has tenderness mild erythema, and inflammation of the right first MTP joint.  CDAI Exam: CDAI Score: -- Patient Global: --; Provider Global: -- Swollen: --; Tender: -- Joint Exam 10/20/2023   No joint exam has been documented for this visit   There is currently no information documented on the homunculus. Go to the Rheumatology activity and complete the homunculus joint exam.  Investigation: No additional findings.  Imaging: No results found.  Recent Labs: Lab Results  Component Value Date   WBC 7.4 07/13/2023   HGB 14.4 07/13/2023   PLT 243.0 07/13/2023   NA 137 07/13/2023   K 4.5 07/13/2023   CL 97 07/13/2023   CO2 31 07/13/2023   GLUCOSE 91 07/13/2023   BUN 19 07/13/2023   CREATININE 1.01 07/13/2023   BILITOT 0.5  07/13/2023   ALKPHOS 79 07/13/2023   AST 16 07/13/2023   ALT 25 07/13/2023   PROT 7.4 07/13/2023   ALBUMIN 4.5 07/13/2023   CALCIUM 9.9 07/13/2023    Speciality Comments: No specialty comments available.  Procedures:  No procedures performed Allergies: Patient has no known allergies.   Assessment / Plan:     Visit Diagnoses: Chronic idiopathic gout involving toe of right foot without tophus -Recurrent flares involving the right 1st MTP joint since end of October 2024-Uric acid 5.6 on 07/27/23, x-rays on 08/05/23 did not reveal any evidence of fracture or arthropathy.  Medications tried include: Colchicine, indomethacin, and prednisone (tapers for both 40 and 60 mg daily), no previous personal history or family history of gout:  Patient has been experiencing recurrent flares involving his right 1st MTP joint.  He has required several courses of prednisone but has not had complete resolution of symptoms since initial presentation.  His pain levels have been as high as a 9 out of 10 and he has had sensitivity even with a bedsheet on his right great toe intermittently.  He has had to change his gait due to not being able to bear full weight on his right great toe at times.  He has made dietary changes and has not drink any alcohol during the month of January.  He primarily eats chicken and has been avoiding shellfish and red meat. On examination today he continues to have persistent inflammation, tenderness, and mild erythema overlying the right first MTP joint.  Currently rates the pain 4/10. He has no other joint inflammation noted on examination.  No tophi noted.   Plan to obtain sed rate, CRP, 14 3 3  eta, RF, anti-CCP today to complete the workup due to chronicity of symptoms.  Uric acid level will also be updated.   Different treatment options for management of gout were discussed today in detail.  Discussed that he will require a urate lowering  medication to control the disease.  Reviewed  indications, contraindications, potential side effects of allopurinol today in detail.  All questions were addressed.  Plan to initiate the patient on allopurinol 100 mg daily x 1 week, 200 mg daily x 1 week, then increase to 300 mg daily.  Patient will take colchicine 0.6 mg 1 tablet daily several days prior to initiating allopurinol to try to prevent an exacerbation of symptoms.  A new prescription for allopurinol and colchicine will be sent to the pharmacy tomorrow pending lab results. He will return for updated lab work in 2 weeks-future order placed today.      Plan: Uric acid  Medication monitoring encounter -Plan to initiate allopurinol and colchicine as discussed above. Plan to obtain CBC, CMP, and uric acid level today.   Future orders for CBC, CMP, and uric acid placed today.  He will have updated lab work in 2 weeks.  Plan: CBC with Differential/Platelet, COMPLETE METABOLIC PANEL WITH GFR, Uric acid, CBC with Differential/Platelet, COMPLETE METABOLIC PANEL WITH GFR  Pain of right great toe - Recurrent pain and swelling-1st MTP-suspect gout. tried prednisone and colchicine. uric acid 5.6 on 07/27/23. Plan to update uric acid today.   Different treatment options were discussed--plan above.  Plan to obtain the following lab work today for further evaluation - Plan: Uric acid, Sedimentation rate, Rheumatoid factor, Cyclic citrul peptide antibody, IgG, Uric acid, 14-3-3 eta Protein, C-reactive protein  Plantar fasciitis, bilateral: History of-No currently symptomatic.    Other medical conditions are listed as follows:  Essential hypertension: Blood pressure was 156/93 today in the office.  OSA (obstructive sleep apnea)  History of hyperlipidemia  Metabolic syndrome  Prediabetes  Vitamin B 12 deficiency  Other fatigue  History of depression   Orders: Orders Placed This Encounter  Procedures   CBC with Differential/Platelet   COMPLETE METABOLIC PANEL WITH GFR   Uric acid    CBC with Differential/Platelet   COMPLETE METABOLIC PANEL WITH GFR   Sedimentation rate   Rheumatoid factor   Cyclic citrul peptide antibody, IgG   Uric acid   14-3-3 eta Protein   C-reactive protein   No orders of the defined types were placed in this encounter.   Follow-Up Instructions: Return for NPFU.   Gearldine Bienenstock, PA-C  Note - This record has been created using Dragon software.  Chart creation errors have been sought, but may not always  have been located. Such creation errors do not reflect on  the standard of medical care.

## 2023-10-20 ENCOUNTER — Encounter: Payer: Self-pay | Admitting: Physician Assistant

## 2023-10-20 ENCOUNTER — Ambulatory Visit: Payer: Commercial Managed Care - PPO | Attending: Physician Assistant | Admitting: Physician Assistant

## 2023-10-20 ENCOUNTER — Other Ambulatory Visit: Payer: Self-pay | Admitting: Pharmacist

## 2023-10-20 VITALS — BP 156/93 | HR 70 | Resp 15 | Ht 70.0 in | Wt 267.0 lb

## 2023-10-20 DIAGNOSIS — G4733 Obstructive sleep apnea (adult) (pediatric): Secondary | ICD-10-CM | POA: Diagnosis not present

## 2023-10-20 DIAGNOSIS — R5383 Other fatigue: Secondary | ICD-10-CM

## 2023-10-20 DIAGNOSIS — M79674 Pain in right toe(s): Secondary | ICD-10-CM | POA: Diagnosis not present

## 2023-10-20 DIAGNOSIS — Z8659 Personal history of other mental and behavioral disorders: Secondary | ICD-10-CM

## 2023-10-20 DIAGNOSIS — Z8639 Personal history of other endocrine, nutritional and metabolic disease: Secondary | ICD-10-CM

## 2023-10-20 DIAGNOSIS — E538 Deficiency of other specified B group vitamins: Secondary | ICD-10-CM

## 2023-10-20 DIAGNOSIS — M722 Plantar fascial fibromatosis: Secondary | ICD-10-CM

## 2023-10-20 DIAGNOSIS — E8881 Metabolic syndrome: Secondary | ICD-10-CM

## 2023-10-20 DIAGNOSIS — R7303 Prediabetes: Secondary | ICD-10-CM

## 2023-10-20 DIAGNOSIS — Z5181 Encounter for therapeutic drug level monitoring: Secondary | ICD-10-CM

## 2023-10-20 DIAGNOSIS — I10 Essential (primary) hypertension: Secondary | ICD-10-CM

## 2023-10-20 DIAGNOSIS — M1A0710 Idiopathic chronic gout, right ankle and foot, without tophus (tophi): Secondary | ICD-10-CM

## 2023-10-20 NOTE — Progress Notes (Signed)
Discussed gout prevention: he has enough attacks to merit prophylaxis with Allopurinol. The possibility of recurrent gout while lowering the uric acid is explained; temporarily colchicine will be needed to prevent acute gout for 3 months. Will build up to 300 mg daily allopurinol, maintain high fluid intake and low purine diet. Long term use and side effect profile discussed. The patient indicates understanding of these issues and agrees with the plan. See 3 months.  Allopurinol dose: 100mg  daily x 1 week then 200mg  daily x 1 week, then 300mg  daily thereafter Take colchicine 0.3mg  daily for gout flare ppx  Does not drink alcohol anymore. Advised to minimize red meat and shellfish. Information placed in AVS  Chesley Mires, PharmD, MPH, BCPS, CPP Clinical Pharmacist (Rheumatology and Pulmonology)

## 2023-10-20 NOTE — Patient Instructions (Addendum)
Standing Labs We placed an order today for your standing lab work.   Please have your standing labs drawn in 2 weeks   Please have your labs drawn 2 weeks prior to your appointment so that the provider can discuss your lab results at your appointment, if possible.  Please note that you may see your imaging and lab results in MyChart before we have reviewed them. We will contact you once all results are reviewed. Please allow our office up to 72 hours to thoroughly review all of the results before contacting the office for clarification of your results.  WALK-IN LAB HOURS  Monday through Thursday from 8:00 am -12:30 pm and 1:00 pm-5:00 pm and Friday from 8:00 am-12:00 pm.  Patients with office visits requiring labs will be seen before walk-in labs.  You may encounter longer than normal wait times. Please allow additional time. Wait times may be shorter on  Monday and Thursday afternoons.  We do not book appointments for walk-in labs. We appreciate your patience and understanding with our staff.   Labs are drawn by [facility]. Please bring your co-pay at the time of your lab draw.  You may receive a bill from [facility] for your lab work.  Please note if you are on Hydroxychloroquine and and an order has been placed for a Hydroxychloroquine level,  you will need to have it drawn 4 hours or more after your last dose.  If you wish to have your labs drawn at another location, please call the office 24 hours in advance so we can fax the orders.  The office is located at [address], [address], [place], [place] [address]   If you have any questions regarding directions or hours of operation,  please call [phone].   As a reminder, please drink plenty of water prior to coming for your lab work. Thanks!  Low-Purine Eating Plan A low-purine eating plan involves making food choices to limit your purine intake. Purine is a kind of uric acid. Too much uric acid in your blood can cause certain  conditions, such as gout and kidney stones. Eating a low-purine diet may help control these conditions. What are tips for following this plan? Shopping Avoid buying products that contain high-fructose corn syrup. Check for this on food labels. It is commonly found in many processed foods and soft drinks. Be sure to check for it in baked goods such as cookies, canned fruits, and cereals and cereal bars. Avoid buying veal, chicken breast with skin, lamb, and organ meats such as liver. These types of meats tend to have the highest purine content. Choose dairy products. These may lower uric acid levels. Avoid certain types of fish. Not all fish and seafood have high purine content. Examples with high purine content include anchovies, trout, tuna, sardines, and salmon. Avoid buying beverages that contain alcohol, particularly beer and hard liquor. Alcohol can affect the way your body gets rid of uric acid. Meal planning  Learn which foods do or do not affect you. If you find out that a food tends to cause your gout symptoms to flare up, avoid eating that food. You can enjoy foods that do not cause problems. If you have any questions about a food item, talk with your dietitian or health care provider. Reduce the overall amount of meat in your diet. When you do eat meat, choose ones with lower purine content. Include plenty of fruits and vegetables. Although some vegetables may have a high purine content--such as  asparagus, mushrooms, spinach, or cauliflower--it has been shown that these do not contribute to uric acid blood levels as much. Consume at least 1 dairy serving a day. This has been shown to decrease uric acid levels. General information If you drink alcohol: Limit how much you have to: 0-1 drink a day for women who are not pregnant. 0-2 drinks a day for men. Know how much alcohol is in a drink. In the [place] one drink equals one 12 oz bottle of beer (355 mL), one 5 oz glass of wine (148 mL), or  one 1 oz glass of hard liquor (44 mL). Drink plenty of water. Try to drink enough to keep your urine pale yellow. Fluids can help remove uric acid from your body. Work with your health care provider and dietitian to develop a plan to achieve or maintain a healthy weight. Losing weight may help reduce uric acid in your blood. What foods are recommended? The following are some types of foods that are good choices when limiting purine intake: Fresh or frozen fruits and vegetables. Whole grains, breads, cereals, and pasta. Rice. Beans, peas, legumes. Nuts and seeds. Dairy products. Fats and oils. The items listed above may not be a complete list. Talk with a dietitian about what dietary choices are best for you. What foods are not recommended? Limit your intake of foods high in purines, including: Beer and other alcohol. Meat-based gravy or sauce. Canned or fresh fish, such as: Anchovies, sardines, herring, salmon, and tuna. Mussels and scallops. Codfish, trout, and haddock. Bacon, veal, chicken breast with skin, and lamb. Organ meats, such as: Liver or kidney. Tripe. Sweetbreads (thymus gland or pancreas). Wild Education officer, environmental. Yeast or yeast extract supplements. Drinks sweetened with high-fructose corn syrup, such as soda. Processed foods made with high-fructose corn syrup. The items listed above may not be a complete list of foods and beverages you should limit. Contact a dietitian for more information. Summary Eating a low-purine diet may help control conditions caused by too much uric acid in the body, such as gout or kidney stones. Choose low-purine foods, limit alcohol, and limit high-fructose corn syrup. You will learn over time which foods do or do not affect you. If you find out that a food tends to cause your gout symptoms to flare up, avoid eating that food. This information is not intended to replace advice given to you by your health care provider. Make sure you discuss any  questions you have with your health care provider. Document Revised: 08/27/2021 Document Reviewed: 08/27/2021 Elsevier Patient Education  2024 [employer].

## 2023-10-20 NOTE — Telephone Encounter (Signed)
Pending labs from today's OV, please send in rx for allopurinol Dose: 100mg  daily x 1 week then 200mg  daily x 1 week then 300mg  daily   He said he had enough colchicine at home but please confirm again  Dose: 0.3mg  daily  Chesley Mires, PharmD, MPH, BCPS, CPP Clinical Pharmacist (Rheumatology and Pulmonology)

## 2023-10-21 MED ORDER — ALLOPURINOL 100 MG PO TABS: ORAL_TABLET | ORAL | 0 refills | Status: DC

## 2023-10-21 MED ORDER — COLCHICINE 0.6 MG PO TABS: 0.6000 mg | ORAL_TABLET | Freq: Every day | ORAL | 1 refills | Status: AC

## 2023-10-21 NOTE — Telephone Encounter (Signed)
Ok to initiate allopurinol 100 mg daily x1 week, 200 mg x1 week, then increase to 300 mg daily   Patient advised to initiate colchicine 0.6 mg 1 tablet daily for 4-5 days prior to starting allopurinol.  If he has symptoms of a flare he can increase colchicine to 0.6 mg BID during the flare

## 2023-10-21 NOTE — Progress Notes (Signed)
Uric acid elevated-7.9.   CBC and CMP WNL  ESR and CRP WNL  RF negative.   Ok to initiate allopurinol 100 mg daily x1 week, 200 mg x1 week, then increase to 300 mg daily   Please advise patient to initiate colchicine 0.6 mg 1 tablet daily for 4-5 days prior to starting allopurinol.  If he has symptoms of a flare he can increase colchicine to 0.6 mg BID during the flare

## 2023-10-27 LAB — CBC WITH DIFFERENTIAL/PLATELET
Absolute Lymphocytes: 2006 {cells}/uL (ref 850–3900)
Absolute Monocytes: 419 {cells}/uL (ref 200–950)
Basophils Absolute: 30 {cells}/uL (ref 0–200)
Basophils Relative: 0.5 %
Eosinophils Absolute: 89 {cells}/uL (ref 15–500)
Eosinophils Relative: 1.5 %
HCT: 43.5 % (ref 38.5–50.0)
Hemoglobin: 14.4 g/dL (ref 13.2–17.1)
MCH: 28.7 pg (ref 27.0–33.0)
MCHC: 33.1 g/dL (ref 32.0–36.0)
MCV: 86.7 fL (ref 80.0–100.0)
MPV: 10.1 fL (ref 7.5–12.5)
Monocytes Relative: 7.1 %
Neutro Abs: 3357 {cells}/uL (ref 1500–7800)
Neutrophils Relative %: 56.9 %
Platelets: 224 10*3/uL (ref 140–400)
RBC: 5.02 10*6/uL (ref 4.20–5.80)
RDW: 12.9 % (ref 11.0–15.0)
Total Lymphocyte: 34 %
WBC: 5.9 10*3/uL (ref 3.8–10.8)

## 2023-10-27 LAB — COMPLETE METABOLIC PANEL WITH GFR
AG Ratio: 1.6 (calc) (ref 1.0–2.5)
ALT: 17 U/L (ref 9–46)
AST: 13 U/L (ref 10–35)
Albumin: 4.7 g/dL (ref 3.6–5.1)
Alkaline phosphatase (APISO): 68 U/L (ref 35–144)
BUN: 21 mg/dL (ref 7–25)
CO2: 29 mmol/L (ref 20–32)
Calcium: 10.2 mg/dL (ref 8.6–10.3)
Chloride: 100 mmol/L (ref 98–110)
Creat: 1.02 mg/dL (ref 0.70–1.35)
Globulin: 2.9 g/dL (ref 1.9–3.7)
Glucose, Bld: 92 mg/dL (ref 65–99)
Potassium: 4.5 mmol/L (ref 3.5–5.3)
Sodium: 138 mmol/L (ref 135–146)
Total Bilirubin: 0.3 mg/dL (ref 0.2–1.2)
Total Protein: 7.6 g/dL (ref 6.1–8.1)
eGFR: 83 mL/min/{1.73_m2} (ref >=60)

## 2023-10-27 LAB — 14-3-3 ETA PROTEIN: 14-3-3 eta Protein: <0.2 ng/mL (ref <0.2)

## 2023-10-27 LAB — CYCLIC CITRUL PEPTIDE ANTIBODY, IGG: Cyclic Citrullin Peptide Ab: <16 U

## 2023-10-27 LAB — URIC ACID: Uric Acid, Serum: 7.9 mg/dL (ref 4.0–8.0)

## 2023-10-27 LAB — SEDIMENTATION RATE: Sed Rate: 19 mm/h (ref 0–20)

## 2023-10-27 LAB — RHEUMATOID FACTOR: Rheumatoid fact SerPl-aCnc: <10 [IU]/mL (ref <14)

## 2023-10-27 LAB — C-REACTIVE PROTEIN: CRP: 6.7 mg/L (ref <8.0)

## 2023-11-02 ENCOUNTER — Telehealth: Payer: Self-pay | Admitting: *Deleted

## 2023-11-02 ENCOUNTER — Other Ambulatory Visit: Payer: Self-pay | Admitting: *Deleted

## 2023-11-02 ENCOUNTER — Other Ambulatory Visit: Payer: Self-pay | Admitting: Physician Assistant

## 2023-11-02 MED ORDER — PREDNISONE 10 MG PO TABS: ORAL_TABLET | ORAL | 0 refills | Status: AC

## 2023-11-02 NOTE — Telephone Encounter (Signed)
 New start allopurinol , developed rash on neck, patient started allopurinol  10/27/2023, neck started itching on 10/29/2023, rash is worsening, redness, itching, warm to the touch,  HT, [place]

## 2023-11-02 NOTE — Telephone Encounter (Signed)
 I send in a prednisone  taper starting at 40 mg tapering by 10 mg every 2 days.

## 2023-11-02 NOTE — Telephone Encounter (Signed)
 Patient will need to discontinue allopurinol  and we will need to discuss other treatment options at his upcoming office visit. Okay to send in a medrol  dosepak to help alleviate the rash.

## 2023-11-03 NOTE — Telephone Encounter (Signed)
 I called patient, patient verbalized understanding.

## 2023-11-10 NOTE — Progress Notes (Deleted)
 Office Visit Note  Patient: Gregg Hamilton             Date of Birth: 20-Nov-1960           MRN: [id number]             PCP: Kristian Covey, MD Referring: Kristian Covey, MD Visit Date: 11/24/2023 Occupation: @GUAROCC @  Subjective:  No chief complaint on file.   History of Present Illness: Gregg Hamilton is a 63 y.o. male ***   Recurrent flares involving the right 1st MTP joint since end of October 2024-Uric acid 5.6 on 07/27/23, x-rays on 08/05/23 did not reveal any evidence of fracture or arthropathy.  Medications tried include: Colchicine, indomethacin, and prednisone (tapers for both 40 and 60 mg daily), no previous personal history or family history of gout:   Allopurinol discontinued due to rash   Activities of Daily Living:  Patient reports morning stiffness for *** {minute/hour:19697}.   Patient {ACTIONS;DENIES/REPORTS:21021675::"Denies"} nocturnal pain.  Difficulty dressing/grooming: {ACTIONS;DENIES/REPORTS:21021675::"Denies"} Difficulty climbing stairs: {ACTIONS;DENIES/REPORTS:21021675::"Denies"} Difficulty getting out of chair: {ACTIONS;DENIES/REPORTS:21021675::"Denies"} Difficulty using hands for taps, buttons, cutlery, and/or writing: {ACTIONS;DENIES/REPORTS:21021675::"Denies"}  No Rheumatology ROS completed.   PMFS History:  Patient Active Problem List   Diagnosis Date Noted   Prediabetes 07/05/2022   OSA (obstructive sleep apnea) 04/18/2018   Metabolic syndrome 11/23/2012   Vitamin B 12 deficiency 12/19/2011   Hyperlipidemia 12/19/2011   Laryngotracheobronchitis 08/12/2011   SORE THROAT 10/07/2010   Other B-complex deficiencies 01/22/2010   Allergic rhinitis 11/17/2009   DIARRHEA 11/17/2009   DEPRESSION 10/21/2009   Essential hypertension 10/21/2009   FATIGUE 10/21/2009    Past Medical History:  Diagnosis Date   Allergy    Depression    Heart murmur    Hyperlipidemia    Hypertension    Metabolic syndrome    Sleep apnea     Family History   Problem Relation Age of Onset   Diabetes Mother    Hypertension Mother    Diabetes Father    Hypertension Father    Diabetes Sister    Hypertension Other    Cancer Other        colon   Healthy Son    Healthy Daughter    Colon cancer Neg Hx    Esophageal cancer Neg Hx    Stomach cancer Neg Hx    Rectal cancer Neg Hx    Past Surgical History:  Procedure Laterality Date   CARPAL TUNNEL RELEASE  01/2018   Left hand   VASECTOMY     Social History   Social History Narrative   Married no tobacco    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   Physical Exam   Musculoskeletal Exam: ***  CDAI Exam: CDAI Score: -- Patient Global: --; Provider Global: -- Swollen: --; Tender: -- Joint Exam 11/24/2023   No joint exam has been documented for this visit   There is currently no information documented on the homunculus. Go to the Rheumatology activity and complete the homunculus joint exam.  Investigation: No additional findings.  Imaging: No results found.  Recent Labs: Lab Results  Component Value Date   WBC 5.9 10/20/2023   HGB 14.4 10/20/2023   PLT 224 10/20/2023   NA 138 10/20/2023   K 4.5 10/20/2023   CL 100  10/20/2023   CO2 29 10/20/2023   GLUCOSE 92 10/20/2023   BUN 21 10/20/2023   CREATININE 1.02 10/20/2023   BILITOT 0.3 10/20/2023   ALKPHOS 79 07/13/2023   AST 13 10/20/2023   ALT 17 10/20/2023   PROT 7.6 10/20/2023   ALBUMIN 4.5 07/13/2023   CALCIUM 10.2 10/20/2023    Speciality Comments: No specialty comments available.  Procedures:  No procedures performed Allergies: Allopurinol   Assessment / Plan:     Visit Diagnoses: Chronic idiopathic gout involving toe of right foot without tophus  Medication monitoring encounter  Plantar fasciitis, bilateral  Essential  hypertension  OSA (obstructive sleep apnea)  History of hyperlipidemia  Metabolic syndrome  Prediabetes  Vitamin B 12 deficiency  Other fatigue  History of depression  Orders: No orders of the defined types were placed in this encounter.  No orders of the defined types were placed in this encounter.   Face-to-face time spent with patient was *** minutes. Greater than 50% of time was spent in counseling and coordination of care.  Follow-Up Instructions: No follow-ups on file.   Gearldine Bienenstock, PA-C  Note - This record has been created using Dragon software.  Chart creation errors have been sought, but may not always  have been located. Such creation errors do not reflect on  the standard of medical care.

## 2023-11-24 ENCOUNTER — Ambulatory Visit: Payer: Commercial Managed Care - PPO | Admitting: Physician Assistant

## 2023-11-24 DIAGNOSIS — M1A0710 Idiopathic chronic gout, right ankle and foot, without tophus (tophi): Secondary | ICD-10-CM

## 2023-11-24 DIAGNOSIS — Z8659 Personal history of other mental and behavioral disorders: Secondary | ICD-10-CM

## 2023-11-24 DIAGNOSIS — E8881 Metabolic syndrome: Secondary | ICD-10-CM

## 2023-11-24 DIAGNOSIS — M722 Plantar fascial fibromatosis: Secondary | ICD-10-CM

## 2023-11-24 DIAGNOSIS — Z8639 Personal history of other endocrine, nutritional and metabolic disease: Secondary | ICD-10-CM

## 2023-11-24 DIAGNOSIS — G4733 Obstructive sleep apnea (adult) (pediatric): Secondary | ICD-10-CM

## 2023-11-24 DIAGNOSIS — I10 Essential (primary) hypertension: Secondary | ICD-10-CM

## 2023-11-24 DIAGNOSIS — R7303 Prediabetes: Secondary | ICD-10-CM

## 2023-11-24 DIAGNOSIS — E538 Deficiency of other specified B group vitamins: Secondary | ICD-10-CM

## 2023-11-24 DIAGNOSIS — R5383 Other fatigue: Secondary | ICD-10-CM

## 2023-11-24 DIAGNOSIS — Z5181 Encounter for therapeutic drug level monitoring: Secondary | ICD-10-CM

## 2023-12-09 ENCOUNTER — Other Ambulatory Visit: Payer: Self-pay | Admitting: Family Medicine

## 2024-04-09 ENCOUNTER — Other Ambulatory Visit: Payer: Self-pay | Admitting: Family Medicine

## 2024-04-19 ENCOUNTER — Telehealth: Payer: Self-pay

## 2024-04-19 NOTE — Telephone Encounter (Signed)
 Copied from CRM [id number]. Topic: General - Other >> Apr 18, 2024  1:44 PM Mercedes MATSU wrote: Reason for CRM:   Jonette calling from [place] Phone: [phone] Medical Records status, request was sent 04/13/2024. Jonette is requesting a call back with this information.

## 2024-04-19 NOTE — Telephone Encounter (Signed)
Form received and placed in PCP folder

## 2024-07-05 ENCOUNTER — Other Ambulatory Visit: Payer: Self-pay | Admitting: Family Medicine
